# Patient Record
Sex: Male | Born: 1960
Health system: Southern US, Community
[De-identification: ages and names within clinical notes are randomized; demographics above are authoritative.]

## PROBLEM LIST (undated history)

## (undated) DIAGNOSIS — I493 Ventricular premature depolarization: Secondary | ICD-10-CM

## (undated) DIAGNOSIS — E781 Pure hyperglyceridemia: Secondary | ICD-10-CM

## (undated) DIAGNOSIS — E785 Hyperlipidemia, unspecified: Secondary | ICD-10-CM

## (undated) DIAGNOSIS — Z9861 Coronary angioplasty status: Secondary | ICD-10-CM

## (undated) DIAGNOSIS — I2119 ST elevation (STEMI) myocardial infarction involving other coronary artery of inferior wall: Secondary | ICD-10-CM

## (undated) DIAGNOSIS — F419 Anxiety disorder, unspecified: Secondary | ICD-10-CM

## (undated) DIAGNOSIS — I2510 Atherosclerotic heart disease of native coronary artery without angina pectoris: Secondary | ICD-10-CM

## (undated) DIAGNOSIS — Z87891 Personal history of nicotine dependence: Secondary | ICD-10-CM

## (undated) DIAGNOSIS — I10 Essential (primary) hypertension: Secondary | ICD-10-CM

## (undated) HISTORY — DX: Coronary angioplasty status: Z98.61

## (undated) HISTORY — DX: ST elevation (STEMI) myocardial infarction involving other coronary artery of inferior wall: I21.19

## (undated) HISTORY — PX: TONSILLECTOMY: SUR1361

## (undated) HISTORY — DX: Hyperlipidemia, unspecified: E78.5

## (undated) HISTORY — DX: Anxiety disorder, unspecified: F41.9

## (undated) HISTORY — DX: Essential (primary) hypertension: I10

## (undated) HISTORY — DX: Personal history of nicotine dependence: Z87.891

## (undated) HISTORY — PX: TRANSTHORACIC ECHOCARDIOGRAM: SHX275

## (undated) HISTORY — DX: Pure hyperglyceridemia: E78.1

## (undated) HISTORY — PX: INGUINAL HERNIA REPAIR: SUR1180

## (undated) HISTORY — DX: Atherosclerotic heart disease of native coronary artery without angina pectoris: I25.10

## (undated) HISTORY — PX: APPENDECTOMY: SHX54

---

## 2006-10-16 ENCOUNTER — Ambulatory Visit (HOSPITAL_COMMUNITY): Admission: RE | Admit: 2006-10-16 | Discharge: 2006-10-16 | Payer: Self-pay | Admitting: Family Medicine

## 2006-12-20 HISTORY — PX: NM MYOVIEW LTD: HXRAD82

## 2006-12-20 HISTORY — PX: DOPPLER ECHOCARDIOGRAPHY: SHX263

## 2013-02-09 DIAGNOSIS — I2119 ST elevation (STEMI) myocardial infarction involving other coronary artery of inferior wall: Secondary | ICD-10-CM

## 2013-02-09 DIAGNOSIS — I2510 Atherosclerotic heart disease of native coronary artery without angina pectoris: Secondary | ICD-10-CM

## 2013-02-09 HISTORY — DX: Atherosclerotic heart disease of native coronary artery without angina pectoris: I25.10

## 2013-02-09 HISTORY — DX: ST elevation (STEMI) myocardial infarction involving other coronary artery of inferior wall: I21.19

## 2013-02-09 HISTORY — PX: CORONARY ANGIOPLASTY WITH STENT PLACEMENT: SHX49

## 2013-02-27 ENCOUNTER — Inpatient Hospital Stay (HOSPITAL_COMMUNITY): Payer: 59

## 2013-02-27 ENCOUNTER — Encounter (HOSPITAL_COMMUNITY)
Admission: EM | Disposition: A | Discharge status: Home / Self Care | Payer: Self-pay | Source: Other Acute Inpatient Hospital | Attending: Cardiology

## 2013-02-27 ENCOUNTER — Encounter (HOSPITAL_COMMUNITY): Payer: Self-pay | Admitting: Cardiology

## 2013-02-27 ENCOUNTER — Inpatient Hospital Stay (HOSPITAL_COMMUNITY)
Admission: EM | Admit: 2013-02-27 | Discharge: 2013-03-03 | DRG: 247 | Disposition: A | Discharge status: Home / Self Care | Payer: 59 | Source: Other Acute Inpatient Hospital | Attending: Cardiology | Admitting: Cardiology

## 2013-02-27 DIAGNOSIS — Z955 Presence of coronary angioplasty implant and graft: Secondary | ICD-10-CM

## 2013-02-27 DIAGNOSIS — I2582 Chronic total occlusion of coronary artery: Secondary | ICD-10-CM | POA: Diagnosis present

## 2013-02-27 DIAGNOSIS — Z8249 Family history of ischemic heart disease and other diseases of the circulatory system: Secondary | ICD-10-CM

## 2013-02-27 DIAGNOSIS — I2119 ST elevation (STEMI) myocardial infarction involving other coronary artery of inferior wall: Principal | ICD-10-CM | POA: Diagnosis present

## 2013-02-27 DIAGNOSIS — I2510 Atherosclerotic heart disease of native coronary artery without angina pectoris: Secondary | ICD-10-CM | POA: Diagnosis present

## 2013-02-27 DIAGNOSIS — E785 Hyperlipidemia, unspecified: Secondary | ICD-10-CM | POA: Diagnosis present

## 2013-02-27 DIAGNOSIS — I10 Essential (primary) hypertension: Secondary | ICD-10-CM | POA: Diagnosis present

## 2013-02-27 DIAGNOSIS — F17200 Nicotine dependence, unspecified, uncomplicated: Secondary | ICD-10-CM | POA: Diagnosis present

## 2013-02-27 DIAGNOSIS — E669 Obesity, unspecified: Secondary | ICD-10-CM | POA: Diagnosis present

## 2013-02-27 DIAGNOSIS — E876 Hypokalemia: Secondary | ICD-10-CM | POA: Diagnosis not present

## 2013-02-27 DIAGNOSIS — E781 Pure hyperglyceridemia: Secondary | ICD-10-CM | POA: Diagnosis present

## 2013-02-27 HISTORY — PX: LEFT HEART CATHETERIZATION WITH CORONARY ANGIOGRAM: SHX5451

## 2013-02-27 HISTORY — PX: PERCUTANEOUS CORONARY STENT INTERVENTION (PCI-S): SHX5485

## 2013-02-27 LAB — HEMOGLOBIN A1C: Hgb A1c MFr Bld: 5.7 % — ABNORMAL HIGH (ref <5.7)

## 2013-02-27 LAB — BASIC METABOLIC PANEL
CO2: 24 mEq/L (ref 19–32)
Chloride: 101 mEq/L (ref 96–112)
Creatinine, Ser: 0.83 mg/dL (ref 0.50–1.35)
GFR calc Af Amer: >90 mL/min (ref >90)
Potassium: 4.7 mEq/L (ref 3.5–5.1)
Sodium: 134 mEq/L — ABNORMAL LOW (ref 135–145)

## 2013-02-27 LAB — CBC
HCT: 44.5 % (ref 39.0–52.0)
MCHC: 34.6 g/dL (ref 30.0–36.0)
RDW: 13.7 % (ref 11.5–15.5)
WBC: 14.8 10*3/uL — ABNORMAL HIGH (ref 4.0–10.5)

## 2013-02-27 LAB — LIPID PANEL
HDL: 32 mg/dL — ABNORMAL LOW (ref >39)
LDL Cholesterol: 98 mg/dL (ref 0–99)
Total CHOL/HDL Ratio: 6 RATIO
Triglycerides: 311 mg/dL — ABNORMAL HIGH (ref <150)
VLDL: 62 mg/dL — ABNORMAL HIGH (ref 0–40)

## 2013-02-27 LAB — COMPREHENSIVE METABOLIC PANEL
ALT: 33 U/L (ref 0–53)
AST: 28 U/L (ref 0–37)
Albumin: 3.8 g/dL (ref 3.5–5.2)
Alkaline Phosphatase: 76 U/L (ref 39–117)
BUN: 13 mg/dL (ref 6–23)
Chloride: 101 mEq/L (ref 96–112)
Potassium: 3.3 mEq/L — ABNORMAL LOW (ref 3.5–5.1)
Sodium: 134 mEq/L — ABNORMAL LOW (ref 135–145)
Total Bilirubin: 0.3 mg/dL (ref 0.3–1.2)
Total Protein: 6.8 g/dL (ref 6.0–8.3)

## 2013-02-27 LAB — MAGNESIUM: Magnesium: 2.1 mg/dL (ref 1.5–2.5)

## 2013-02-27 LAB — POCT I-STAT, CHEM 8
Calcium, Ion: 1.15 mmol/L (ref 1.12–1.23)
Chloride: 104 mEq/L (ref 96–112)
Creatinine, Ser: 1 mg/dL (ref 0.50–1.35)
Glucose, Bld: 99 mg/dL (ref 70–99)
HCT: 47 % (ref 39.0–52.0)
Hemoglobin: 16 g/dL (ref 13.0–17.0)
Potassium: 3.4 mEq/L — ABNORMAL LOW (ref 3.5–5.1)

## 2013-02-27 LAB — PROTIME-INR: Prothrombin Time: 12.6 seconds (ref 11.6–15.2)

## 2013-02-27 LAB — TROPONIN I
Troponin I: <0.3 ng/mL (ref <0.30)
Troponin I: >20 ng/mL (ref <0.30)

## 2013-02-27 SURGERY — LEFT HEART CATHETERIZATION WITH CORONARY ANGIOGRAM
Anesthesia: LOCAL

## 2013-02-27 MED ORDER — POTASSIUM CHLORIDE CRYS ER 20 MEQ PO TBCR
40.0000 meq | EXTENDED_RELEASE_TABLET | Freq: Once | ORAL | Status: AC
  Administered 2013-02-27: 40 meq via ORAL
  Filled 2013-02-27: qty 2

## 2013-02-27 MED ORDER — TICAGRELOR 90 MG PO TABS
ORAL_TABLET | ORAL | Status: AC
  Filled 2013-02-27: qty 2

## 2013-02-27 MED ORDER — NITROGLYCERIN IN D5W 200-5 MCG/ML-% IV SOLN
2.0000 ug/min | INTRAVENOUS | Status: DC
  Administered 2013-02-27: 5 ug/min via INTRAVENOUS
  Filled 2013-02-27: qty 250

## 2013-02-27 MED ORDER — TICAGRELOR 90 MG PO TABS
90.0000 mg | ORAL_TABLET | Freq: Two times a day (BID) | ORAL | Status: DC
  Administered 2013-02-27 – 2013-03-03 (×8): 90 mg via ORAL
  Filled 2013-02-27 (×10): qty 1

## 2013-02-27 MED ORDER — CARVEDILOL 6.25 MG PO TABS
6.2500 mg | ORAL_TABLET | ORAL | Status: AC
  Administered 2013-02-27: 6.25 mg via ORAL
  Filled 2013-02-27: qty 1

## 2013-02-27 MED ORDER — ZOLPIDEM TARTRATE 5 MG PO TABS
5.0000 mg | ORAL_TABLET | Freq: Every evening | ORAL | Status: DC | PRN
  Administered 2013-02-27 – 2013-03-02 (×2): 5 mg via ORAL
  Filled 2013-02-27 (×2): qty 1

## 2013-02-27 MED ORDER — SODIUM CHLORIDE 0.9 % IV SOLN
0.2500 mg/kg/h | INTRAVENOUS | Status: DC
  Filled 2013-02-27: qty 250

## 2013-02-27 MED ORDER — ACETAMINOPHEN 325 MG PO TABS: 650.0000 mg | ORAL_TABLET | ORAL | Status: DC | PRN

## 2013-02-27 MED ORDER — MIDAZOLAM HCL 2 MG/2ML IJ SOLN
INTRAMUSCULAR | Status: AC
  Filled 2013-02-27: qty 2

## 2013-02-27 MED ORDER — HEPARIN (PORCINE) IN NACL 2-0.9 UNIT/ML-% IJ SOLN
INTRAMUSCULAR | Status: AC
  Filled 2013-02-27: qty 1000

## 2013-02-27 MED ORDER — MAGNESIUM SULFATE 40 MG/ML IJ SOLN
2.0000 g | Freq: Once | INTRAMUSCULAR | Status: AC
  Administered 2013-02-27: 2 g via INTRAVENOUS
  Filled 2013-02-27: qty 50

## 2013-02-27 MED ORDER — SODIUM CHLORIDE 0.9 % IV SOLN
0.2500 mg/kg/h | INTRAVENOUS | Status: AC
  Filled 2013-02-27: qty 250

## 2013-02-27 MED ORDER — NITROGLYCERIN 0.4 MG SL SUBL: 0.4000 mg | SUBLINGUAL_TABLET | SUBLINGUAL | Status: DC | PRN

## 2013-02-27 MED ORDER — ONDANSETRON HCL 4 MG/2ML IJ SOLN: 4.0000 mg | Freq: Four times a day (QID) | INTRAMUSCULAR | Status: DC | PRN

## 2013-02-27 MED ORDER — CARVEDILOL 6.25 MG PO TABS
6.2500 mg | ORAL_TABLET | Freq: Two times a day (BID) | ORAL | Status: DC
  Administered 2013-02-27: 6.25 mg via ORAL
  Filled 2013-02-27 (×2): qty 1

## 2013-02-27 MED ORDER — SODIUM CHLORIDE 0.9 % IV SOLN: 1.0000 mL/kg/h | INTRAVENOUS | Status: AC

## 2013-02-27 MED ORDER — ALPRAZOLAM 0.25 MG PO TABS
0.2500 mg | ORAL_TABLET | Freq: Three times a day (TID) | ORAL | Status: DC | PRN
  Administered 2013-02-27 – 2013-03-02 (×3): 0.25 mg via ORAL
  Filled 2013-02-27 (×2): qty 1

## 2013-02-27 MED ORDER — NITROGLYCERIN 0.2 MG/ML ON CALL CATH LAB
INTRAVENOUS | Status: AC
  Filled 2013-02-27: qty 1

## 2013-02-27 MED ORDER — MORPHINE SULFATE 2 MG/ML IJ SOLN
2.0000 mg | INTRAMUSCULAR | Status: DC | PRN
  Administered 2013-02-27 (×2): 2 mg via INTRAVENOUS
  Filled 2013-02-27 (×3): qty 1

## 2013-02-27 MED ORDER — POTASSIUM CHLORIDE 20 MEQ/15ML (10%) PO LIQD
40.0000 meq | Freq: Every day | ORAL | Status: DC
  Administered 2013-02-27: 40 meq via ORAL
  Filled 2013-02-27 (×2): qty 30

## 2013-02-27 MED ORDER — FENTANYL CITRATE 0.05 MG/ML IJ SOLN
INTRAMUSCULAR | Status: AC
  Filled 2013-02-27: qty 2

## 2013-02-27 MED ORDER — METOPROLOL TARTRATE 25 MG PO TABS: 25.0000 mg | ORAL_TABLET | Freq: Two times a day (BID) | ORAL | Status: DC

## 2013-02-27 MED ORDER — BIVALIRUDIN 250 MG IV SOLR
INTRAVENOUS | Status: AC
  Filled 2013-02-27: qty 250

## 2013-02-27 MED ORDER — ATORVASTATIN CALCIUM 80 MG PO TABS
80.0000 mg | ORAL_TABLET | Freq: Every day | ORAL | Status: DC
  Administered 2013-02-27 – 2013-03-02 (×4): 80 mg via ORAL
  Filled 2013-02-27 (×6): qty 1

## 2013-02-27 MED ORDER — ASPIRIN EC 81 MG PO TBEC
81.0000 mg | DELAYED_RELEASE_TABLET | Freq: Every day | ORAL | Status: DC
  Administered 2013-02-28 – 2013-03-01 (×2): 81 mg via ORAL
  Filled 2013-02-27 (×3): qty 1

## 2013-02-27 MED ORDER — SODIUM CHLORIDE 0.9 % IV SOLN: INTRAVENOUS | Status: DC

## 2013-02-27 MED ORDER — LIDOCAINE HCL (PF) 1 % IJ SOLN
INTRAMUSCULAR | Status: AC
  Filled 2013-02-27: qty 30

## 2013-02-27 MED ORDER — OXYCODONE HCL 5 MG PO TABS: 5.0000 mg | ORAL_TABLET | ORAL | Status: DC | PRN

## 2013-02-27 MED ORDER — CARVEDILOL 12.5 MG PO TABS
12.5000 mg | ORAL_TABLET | Freq: Two times a day (BID) | ORAL | Status: DC
  Administered 2013-02-28 – 2013-03-03 (×7): 12.5 mg via ORAL
  Filled 2013-02-27 (×11): qty 1

## 2013-02-27 NOTE — Care Management Note (Addendum)
    Page 1 of 1   03/03/2013     9:45:06 AM   CARE MANAGEMENT NOTE 03/03/2013  Patient:  Louis Bruce, Louis Bruce   Account Number:  [id number]  Date Initiated:  02/27/2013  Documentation initiated by:  Junius Creamer  Subjective/Objective Assessment:   adm w mi     Action/Plan:   lives w wife   Anticipated DC Date:     Anticipated DC Plan:  HOME/SELF CARE      DC Planning Services  CM consult  Medication Assistance      Choice offered to / List presented to:             Status of service:   Medicare Important Message given?   (If response is "NO", the following Medicare IM given date fields will be blank) Date Medicare IM given:   Date Additional Medicare IM given:    Discharge Disposition:  HOME/SELF CARE  Per UR Regulation:  Reviewed for med. necessity/level of care/duration of stay  If discussed at Long Length of Stay Meetings, dates discussed:    Comments:  12/23  0944 debbie Camila [place] rn,bsn gave pt another 30ay free and copay assist card. went over how to use w pt. for dc home today.  12/19 1617 debbie Benino Korinek rn,bsn left brilinta 30day free and pt assist form w pt's nse. pt just adm from cath lab and nse states anxious and she will give to pt later.

## 2013-02-27 NOTE — H&P (Signed)
Louis Bruce is an 52 y.o. male.    Primary Cardiologist:[facility] Dr. Herbie [place] No primary provider on file.  Chief Complaint: chest pain HPI:  52 year old male present emergently to the cath lab by EMS with STEMI, inf wall ST elevation 9 mm elevation.  Pain began at 1215 today.  EMS called and pt given 4 baby ASA and IV morphine.  Pt has not seen MD in many years.  Hx of elevated triglycerides.  + tobacco use.  Past Medical History  Diagnosis Date  . High triglycerides     in the past  . HTN (hypertension)     treated in the past    Past Surgical History  Procedure Laterality Date  . Appendectomy    . Inguinal hernia repair      Family History  Problem Relation Age of Onset  . Cancer Mother   . Cancer Father   . Cancer Brother    Social History:  reports that he has been smoking.  He has never used smokeless tobacco. He reports that he drinks about 3.0 ounces of alcohol per week. He reports that he does not use illicit drugs.  Allergies: No Known Allergies  OutPatient Medications: none  Results for orders placed during the hospital encounter of 02/27/13 (from the past 48 hour(s))  CBC     Status: Abnormal   Collection Time    02/27/13  2:22 PM      Result Value Range   WBC 14.8 (*) 4.0 - 10.5 K/uL   RBC 5.07  4.22 - 5.81 MIL/uL   Hemoglobin 15.4  13.0 - 17.0 g/dL   HCT 16.1  09.6 - 04.5 %   MCV 87.8  78.0 - 100.0 fL   MCH 30.4  26.0 - 34.0 pg   MCHC 34.6  30.0 - 36.0 g/dL   RDW 40.9  81.1 - 91.4 %   Platelets 254  150 - 400 K/uL  COMPREHENSIVE METABOLIC PANEL     Status: Abnormal   Collection Time    02/27/13  2:22 PM      Result Value Range   Sodium 134 (*) 135 - 145 mEq/L   Potassium 3.3 (*) 3.5 - 5.1 mEq/L   Chloride 101  96 - 112 mEq/L   CO2 15 (*) 19 - 32 mEq/L   Glucose, Bld 93  70 - 99 mg/dL   BUN 13  6 - 23 mg/dL   Creatinine, Ser 7.82  0.50 - 1.35 mg/dL   Calcium 8.8  8.4 - 95.6 mg/dL   Total Protein 6.8  6.0 - 8.3 g/dL   Albumin  3.8  3.5 - 5.2 g/dL   AST 28  0 - 37 U/L   ALT 33  0 - 53 U/L   Alkaline Phosphatase 76  39 - 117 U/L   Total Bilirubin 0.3  0.3 - 1.2 mg/dL   GFR calc non Af Amer >90  >90 mL/min   GFR calc Af Amer >90  >90 mL/min   Comment: (NOTE)     The eGFR has been calculated using the CKD EPI equation.     This calculation has not been validated in all clinical situations.     eGFR's persistently <90 mL/min signify possible Chronic Kidney     Disease.  LIPID PANEL     Status: Abnormal   Collection Time    02/27/13  2:22 PM      Result Value Range  Cholesterol 192  0 - 200 mg/dL   Triglycerides 161 (*) <150 mg/dL   HDL 32 (*) >09 mg/dL   Total CHOL/HDL Ratio 6.0     VLDL 62 (*) 0 - 40 mg/dL   LDL Cholesterol 98  0 - 99 mg/dL   Comment:            Total Cholesterol/HDL:CHD Risk     Coronary Heart Disease Risk Table                         Men   Women      1/2 Average Risk   3.4   3.3      Average Risk       5.0   4.4      2 X Average Risk   9.6   7.1      3 X Average Risk  23.4   11.0                Use the calculated Patient Ratio     above and the CHD Risk Table     to determine the patient's CHD Risk.                ATP III CLASSIFICATION (LDL):      <100     mg/dL   Optimal      604-540  mg/dL   Near or Above                        Optimal      130-159  mg/dL   Borderline      981-191  mg/dL   High      >478     mg/dL   Very High  PROTIME-INR     Status: None   Collection Time    02/27/13  2:22 PM      Result Value Range   Prothrombin Time 12.6  11.6 - 15.2 seconds   INR 0.96  0.00 - 1.49  APTT     Status: None   Collection Time    02/27/13  2:22 PM      Result Value Range   aPTT 27  24 - 37 seconds  CK TOTAL AND CKMB     Status: None   Collection Time    02/27/13  2:22 PM      Result Value Range   Total CK 132  7 - 232 U/L   CK, MB 2.8  0.3 - 4.0 ng/mL   Relative Index 2.1  0.0 - 2.5  TROPONIN I     Status: None   Collection Time    02/27/13  2:22 PM       Result Value Range   Troponin I <0.30  <0.30 ng/mL   Comment:            Due to the release kinetics of cTnI,     a negative result within the first hours     of the onset of symptoms does not rule out     myocardial infarction with certainty.     If myocardial infarction is still suspected,     repeat the test at appropriate intervals.   No results found.  ROS: General:no colds or fevers, no weight changes Skin:no rashes or ulcers HEENT:no blurred vision, no congestion CV:see HPI PUL:see HPI GI:no diarrhea constipation or melena, + hemorrhoids with occ bright bloodno indigestion GU:no hematuria,  no dysuria MS:no joint pain, no claudication Neuro:no syncope, no lightheadedness Endo:no diabetes, no thyroid disease   Height 5\' 9"  (1.753 m), weight 240 lb (108.863 kg). PE: General:Pleasant affect, NAD Skin:Warm and dry, brisk capillary refill HEENT:normocephalic, sclera clear, mucus membranes moist Neck:supple, no JVD, no bruits  Heart:S1S2 RRR without murmur, gallup, rub or click Lungs:clear ant, without rales, rhonchi, or wheezes MVH:QIONG, soft, non tender, + BS, do not palpate liver spleen or masses Ext:no lower ext edema, 2+ -3+ pedal pulses, 2+ radial pulses Neuro:alert and oriented, MAE, follows commands, + facial symmetry    Assessment/Plan Principal Problem:   ST elevation myocardial infarction (STEMI) of inferior wall Active Problems:   Hypokalemia   HTN (hypertension)  PLAN:  To the cath lab emergently.  Then admit to 2 Heart unit for management of MI.  Add BB, statin, ACE as BP tolerates.    [facility] R Nurse Practitioner Certified [facility] Pager [phone] or after 5pm or weekends call [phone] 02/27/2013, 3:23 PM  I have seen and evaluated the patient this PM along with Nada Boozer, NPA. I agree with HER findings, examination as well as impression recommendations.  I met him in the ER & walked with him to Cardiac CATH lab.   Dramatic Inferior STEMI.  Plan emergent cath.   Marykay Lex, M.D., M.S. [facility] [address] [place], [place]  [address]  [phone] Pager # [phone] 02/27/2013 4:07 PM

## 2013-02-27 NOTE — H&P (Signed)
History and Physical Interval Note:  NAME:  Louis Bruce   MRN: [id number] DOB:  [birth year]   ADMIT DATE: 02/27/2013   02/27/2013 3:23 PM  Louis Bruce is a 52 y.o. male with PMH of HTN & HLD & smoker - essentially not taking any medications.  Was finally enjoying a day off today & @ ~1210 PM felt crushing SSCP ~8-10/10. Called his wife after ~30 min.  EMS arrived ~45 min post onset of pain -- ~6-8 mm Inferior STE.  Code STEMI called -- brought directly to cath lab, CP still ~5/10.  Hemodynamically stable.   Past Medical History  Diagnosis Date  . High triglycerides     in the past  . HTN (hypertension)     treated in the past   Past Surgical History  Procedure Laterality Date  . Appendectomy    . Inguinal hernia repair      FAMHx: Family History  Problem Relation Age of Onset  . Cancer Mother   . Cancer Father   . Cancer Brother     SOCHx:  reports that he has been smoking.  He has never used smokeless tobacco. He reports that he drinks about 3.0 ounces of alcohol per week. He reports that he does not use illicit drugs.  ALLERGIES: No Known Allergies  HOME MEDICATIONS: No prescriptions prior to admission    PHYSICAL EXAM:Height 5\' 9"  (1.753 m), weight 240 lb (108.863 kg). Please see full H&P    IMPRESSION & PLAN The patients' history has been reviewed, patient examined, no change in status from most recent note, stable for surgery. I have reviewed the patients' chart and labs. Questions were answered to the patient's satisfaction.    Louis Bruce has presented today for surgery, with the diagnosis of INFERIOR STEMI. The various methods of treatment have been discussed with the patient and family.   Risks / Complications include, but not limited to: Death, MI, CVA/TIA, VF/VT (with defibrillation), Bradycardia (need for temporary pacer placement), contrast induced nephropathy, bleeding / bruising / hematoma / pseudoaneurysm, vascular or coronary injury (with  possible emergent CT or Vascular Surgery), adverse medication reactions, infection.     After consideration of risks, benefits and other options for treatment, the patient has provided emergent verbal consent for Procedure(s):  LEFT HEART CATHETERIZATION AND CORONARY ANGIOGRAPHY +/- AD HOC PERCUTANEOUS CORONARY INTERVENTION  as a surgical intervention.   We will proceed with the planned procedure.   HARDING,[facility] [address] [place], [place]  [address]  [phone]  02/27/2013 3:23 PM

## 2013-02-27 NOTE — CV Procedure (Signed)
CARDIAC CATHETERIZATION AND PERCUTANEOUS CORONARY INTERVENTION REPORT  NAME:  Louis Bruce   MRN: [id number] DOB:  [birth year]   ADMIT DATE: 02/27/2013 Procedure Date: 02/27/2013  INTERVENTIONAL CARDIOLOGIST: Marykay Lex, M.D., MS PRIMARY CARE PROVIDER: No primary provider on file. PRIMARY CARDIOLOGIST: Marykay Lex, MD  PATIENT:  Louis Bruce is a 52 y.o. male who rarely seeks medical attention, but previously diagnosed with HTN & HLD.  He is a long term smoker.  Was in [facility] until roughly 12:15 this afternoon while outside getting ready to go creatinine 100 when he developed substernal chest pressure of roughly 8-9/10. He initially told his wife recently called EMS. They arrived roughly 40 minutes after onset of pain and noted roughly 9 mm inferior ST elevations. Code STEMI was called and he was transferred directly by EMS through the ER to the Cath Lab. I met him in the ER, and he still had about 5/10 pain.  PRE-OPERATIVE DIAGNOSIS:    Inferior ST Elevation MI  PROCEDURES PERFORMED:    Left Heart Catheterization with Coronary Angiography  Successful Percutaneous Coronary Intervention of the distal RCA 100% occlusion with 2 overlapping Xience Alpine DES Stents (3.5 mm x 18 mm distal, 3.5 mm x 8 mm proximal -- postdilated to roughly 3.7 mm  PROCEDURE:Consent:  Risks of procedure as well as the alternatives and risks of each were explained to the patient.  Verbal consent for procedure obtained, as we were unable to obtain consent because of altered level of consciousness. Consent for signed by MD and patient with RN witness -- placed on chart.   PROCEDURE: The patient was brought to the 2nd Floor [place] Cardiac Catheterization Lab in the fasting state and prepped and draped in the usual sterile fashion for Right groin or radial access. A modified Allen's test with plethysmography was performed, revealing excellent Ulnar artery collateral flow.  Sterile technique was used  including antiseptics, cap, gloves, gown, hand hygiene, mask and sheet.  Skin prep: Chlorhexidine.  Time Out: Verified patient identification, verified procedure, site/side was marked, verified correct patient position, special equipment/implants available, medications/allergies/relevent history reviewed, required imaging and test results available.  Performed  Access: Right Radial Artery; 6 Fr Sheath --  Seldinger technique (Angiocath Micropuncture Kit)  IA Radial Cocktail - 10 mL, IV Angiomax Diagnostic:  5 [place] TIG 4.0, 6 [place] JR 4 guide,  5 [place] JL 3.5 and Angled Pigtail advanced and exchanged over long exchange safety J-wire.  Left Coronary Artery Angiography: TIG 4.0 pre-PCI, JL 3.5 post PCI  Right Coronary Artery Angiography: TIG 4.0 followed by 6 [place] JR 4 Guide  LV Hemodynamics (LV Gram): Angled pigtail  TR Band:  1515 Hours, 13 mL air  MEDICATIONS:  Anesthesia:  Local Lidocaine 2 ml  Sedation:  3 mg IV Versed, 75 mcg IV fentanyl ;   Premedication: 324 mg aspirin by EMS  Omnipaque Contrast: 145 ml  Anticoagulation:  Angiomax Bolus & drip  Anti-Platelet Agent:  Brilinta 180 mg Radial Cocktail: 5 mg Verapamil, 400 mcg NTG, 2 ml 2% Lidocaine in 10 ml NS  Hemodynamics:  Central Aortic / Mean Pressures: 115/74 mmHg; 90 mmHg  Left Ventricular Pressures / EDP: 114/15 mmHg; 16 mmHg  Left Ventriculography:  EF:  Roughly 50 %  Wall Motion: Basal to mid inferior hypokinesis  Coronary Anatomy:  Left Main: Large-caliber vessel that trifurcates into the LAD and Circumflex as well as a small Ramus Intermedius. LAD: Begins a large caliber vessel gives rise to a very  proximal septal perforator trunk, just beyond this there is a focal roughly 70% stenosis.  Just beyond this there is a moderate caliber first diagonal branch followed by a moderate roughly 50% stenosis at the takeoff of a second set perforator trunk. As small to moderate caliber second diagonal  branch.  D1: Moderate caliber vessel, with several branches. Minimal luminal irregularities.  D2: Small to moderate caliber vessel, minimal luminal irregularities Left Circumflex: Large-caliber vessel with proximal roughly 20-30% stenosis this vessel is essentially courses as a lateral OM with several small branches. There is a very small AV groove branch beyond the AV groove takeoff there is minimal luminal irregularities.  Ramus intermedius: Small to moderate caliber vessel with a roughly 60% proximal stenosis. This is a barely 2 mm vessel   RCA: Large caliber dominant vessel with an anterior takeoff. There is a very proximal SA nodal branch followed by a roughly 40-50% focal "napkin ring" lesion takeoff of a small atrial branch. The mid vessel has his 20% lesions distally the vessel is 100% occluded after an initial taper.   After wire placement, there was TIMI-3 flow distally beyond the stenosis revealing an extensive Right Posterior Descending Artery and Right Posterior AV Groove Branch (RPAV) with several RPL branches. Beyond the distal RCA occlusion, there is minimal luminal irregularities with the most notable being a focal 20% lesion at the ostium of the PDA.  After reviewing the initial angiography, clear culprit lesion for the inferior STEMI was occluded distal RCA. Potential lesions in the left system were noted and therefore post PCI at JL 35 catheter was used to obtain better angiographic images of the left coronary system. Preparations were made to proceed with emergent PCI of the distal RCA.  Percutaneous Coronary Intervention:   Guide: 6 Fr   JR 4 Guidewire: BMW Predilation Balloon: Trek 2.5 mm x 12 mm;   10 Atm x 36 Sec, 12 Atm x 36 Sec, Stent #1: Xience Alpine DES  3.5  mm x 18 mm;   14 Atm x 45 Sec: Final Diameter 3.65 mm  Post deployment angiography revealed a ~50% stenosis just proximal to the proximal stent edge not previously identified.  The decision was made to  place a second proximally overlapping stent. Stent #2: Xience Alpine DES 3.5  mm x 8 mm;   Deployed: 14 Atm x 45 Sec,    Stent Overlap inflation: 16 Atm x 45 Sec.    Final Diameter = 3.7 mm Post-dilation Balloon: [place] Euphora 3.75 mm x 15 mm; middle of stented area including overlap  14 Atm x 45 Sec,   Final Diameter = 3.7 mm  Post deployment angiography in multiple views, with and without guidewire in place revealed excellent stent deployment and lesion coverage.  There was no evidence of dissection or perforation.  The JR 4 guide cath was exchanged for a JR 3.5 catheter followed by an angled pigtail catheter for continued LCA angiography as well as LV gram.  PATIENT DISPOSITION:    The patient was transferred to the PACU holding area in a hemodynamicaly stable, chest pain free condition.  The patient tolerated the procedure well, and there were no complications.  EBL:   < 10 ml  The patient was stable before, during, and after the procedure.  POST-OPERATIVE DIAGNOSIS:    Significant inferior ST elevations do to 100 occluded in the distal RCA treated with 2 overlapping Xience Alpine DES stents as described.  Residual proximal/mid LAD roughly 70% lesions.  Diffuse moderate  disease throughout the remainder of the coronary arteries.  Preserved EF with basal to mid inferior hypokinesis consistent with RCA stenosis.  PLAN OF CARE:  Admit to CCU for post STEMI/radial cath care.  Continue Angiomax at reduced rate for 4 hours post PCI to  Initiate beta blocker and statin therapy.  I will reviewed images with colleagues to determine appropriateness of proceeding with FFR of the LAD lesion prior to discharge on Monday.  Minimum of 1 year Dual Antiplatelet Therapy  The procedure was discussed with the patient and family with imaging and reviewed.   Marykay Lex, M.D., M.S. [facility] [address] [place], [place]   [address]  [phone]  02/27/2013 3:40 PM

## 2013-02-28 DIAGNOSIS — I2510 Atherosclerotic heart disease of native coronary artery without angina pectoris: Secondary | ICD-10-CM

## 2013-02-28 DIAGNOSIS — I2119 ST elevation (STEMI) myocardial infarction involving other coronary artery of inferior wall: Secondary | ICD-10-CM

## 2013-02-28 LAB — BASIC METABOLIC PANEL
BUN: 9 mg/dL (ref 6–23)
CO2: 23 mEq/L (ref 19–32)
Calcium: 8.8 mg/dL (ref 8.4–10.5)
Chloride: 101 mEq/L (ref 96–112)
Creatinine, Ser: 0.84 mg/dL (ref 0.50–1.35)
GFR calc non Af Amer: >90 mL/min (ref >90)
Glucose, Bld: 98 mg/dL (ref 70–99)
Sodium: 135 mEq/L (ref 135–145)

## 2013-02-28 LAB — TROPONIN I: Troponin I: >20 ng/mL (ref <0.30)

## 2013-02-28 LAB — HEMOGLOBIN A1C
Hgb A1c MFr Bld: 5.7 % — ABNORMAL HIGH (ref <5.7)
Mean Plasma Glucose: 117 mg/dL — ABNORMAL HIGH (ref <117)

## 2013-02-28 LAB — CBC
HCT: 43.8 % (ref 39.0–52.0)
MCH: 30.5 pg (ref 26.0–34.0)
MCHC: 33.8 g/dL (ref 30.0–36.0)
MCV: 90.3 fL (ref 78.0–100.0)
Platelets: 232 10*3/uL (ref 150–400)
RBC: 4.85 MIL/uL (ref 4.22–5.81)
WBC: 16.1 10*3/uL — ABNORMAL HIGH (ref 4.0–10.5)

## 2013-02-28 LAB — TSH: TSH: 1.416 u[IU]/mL (ref 0.350–4.500)

## 2013-02-28 LAB — T4, FREE: Free T4: 1.28 ng/dL (ref 0.80–1.80)

## 2013-02-28 MED ORDER — NICOTINE 7 MG/24HR TD PT24
7.0000 mg | MEDICATED_PATCH | Freq: Every day | TRANSDERMAL | Status: DC
  Administered 2013-02-28 – 2013-03-02 (×3): 7 mg via TRANSDERMAL
  Filled 2013-02-28 (×5): qty 1

## 2013-02-28 NOTE — Progress Notes (Signed)
Subjective:  Feels back to normal  Objective:  Temp:  [98.6 F (37 C)-99.1 F (37.3 C)] 98.6 F (37 C) (12/20 0800) Pulse Rate:  [57-95] 65 (12/20 1100) Resp:  [9-23] 19 (12/20 1100) BP: (91-158)/(54-100) 108/69 mmHg (12/20 1100) SpO2:  [95 %-100 %] 96 % (12/20 1100) Weight:  [240 lb (108.863 kg)-240 lb 4.8 oz (109 kg)] 240 lb 4.8 oz (109 kg) (12/20 0458) Weight change:   Intake/Output from previous day: 12/19 0701 - 12/20 0700 In: 1217.7 [P.O.:600; I.V.:567.7; IV Piggyback:50] Out: 700 [Urine:700]  Intake/Output from this shift: Total I/O In: 400 [P.O.:400] Out: 770 [Urine:770]  Medications: Current Facility-Administered Medications  Medication Dose Route Frequency Provider Last Rate Last Dose  . 0.9 %  sodium chloride infusion   Intravenous Continuous Nada Boozer, NP      . acetaminophen (TYLENOL) tablet 650 mg  650 mg Oral Q4H PRN Nada Boozer, NP      . ALPRAZolam Prudy Feeler) tablet 0.25 mg  0.25 mg Oral TID PRN Nada Boozer, NP   0.25 mg at 02/28/13 0319  . aspirin EC tablet 81 mg  81 mg Oral Daily Nada Boozer, NP   81 mg at 02/28/13 0920  . atorvastatin (LIPITOR) tablet 80 mg  80 mg Oral q1800 Nada Boozer, NP   80 mg at 02/27/13 1734  . carvedilol (COREG) tablet 12.5 mg  12.5 mg Oral BID WC Glori Luis, MD   12.5 mg at 02/28/13 0920  . morphine 2 MG/ML injection 2 mg  2 mg Intravenous Q1H PRN Marykay Lex, MD   2 mg at 02/27/13 1936  . nitroGLYCERIN (NITROSTAT) SL tablet 0.4 mg  0.4 mg Sublingual Q5 Min x 3 PRN Nada Boozer, NP      . nitroGLYCERIN 0.2 mg/mL in dextrose 5 % infusion  2-200 mcg/min Intravenous Continuous Marykay Lex, MD   10 mcg/min at 02/27/13 2200  . ondansetron (ZOFRAN) injection 4 mg  4 mg Intravenous Q6H PRN Nada Boozer, NP      . oxyCODONE (Oxy IR/ROXICODONE) immediate release tablet 5 mg  5 mg Oral Q4H PRN Glori Luis, MD      . Ticagrelor [facility]) tablet 90 mg  90 mg Oral BID Marykay Lex, MD   90 mg at 02/28/13 0920  .  zolpidem (AMBIEN) tablet 5 mg  5 mg Oral QHS PRN,MR X 1 Nada Boozer, NP   5 mg at 02/27/13 2129    Physical Exam: General appearance: alert, cooperative, no distress and moderately obese Neck: no adenopathy, no carotid bruit, no JVD, supple, symmetrical, trachea midline and thyroid not enlarged, symmetric, no tenderness/mass/nodules Lungs: clear to auscultation bilaterally Heart: regular rate and rhythm, S1, S2 normal, no murmur, click, rub or gallop Abdomen: soft, non-tender; bowel sounds normal; no masses,  no organomegaly Extremities: extremities normal, atraumatic, no cyanosis or edema Pulses: 2+ and symmetric healthy radial access site Skin: Skin color, texture, turgor normal. No rashes or lesions Neurologic: Alert and oriented X 3, normal strength and tone. Normal symmetric reflexes. Normal coordination and gait  Lab Results: Results for orders placed during the hospital encounter of 02/27/13 (from the past 48 hour(s))  CBC     Status: Abnormal   Collection Time    02/27/13  2:22 PM      Result Value Range   WBC 14.8 (*) 4.0 - 10.5 K/uL   RBC 5.07  4.22 - 5.81 MIL/uL   Hemoglobin 15.4  13.0 - 17.0 g/dL   HCT  44.5  39.0 - 52.0 %   MCV 87.8  78.0 - 100.0 fL   MCH 30.4  26.0 - 34.0 pg   MCHC 34.6  30.0 - 36.0 g/dL   RDW 16.1  09.6 - 04.5 %   Platelets 254  150 - 400 K/uL  COMPREHENSIVE METABOLIC PANEL     Status: Abnormal   Collection Time    02/27/13  2:22 PM      Result Value Range   Sodium 134 (*) 135 - 145 mEq/L   Potassium 3.3 (*) 3.5 - 5.1 mEq/L   Chloride 101  96 - 112 mEq/L   CO2 15 (*) 19 - 32 mEq/L   Glucose, Bld 93  70 - 99 mg/dL   BUN 13  6 - 23 mg/dL   Creatinine, Ser 4.09  0.50 - 1.35 mg/dL   Calcium 8.8  8.4 - 81.1 mg/dL   Total Protein 6.8  6.0 - 8.3 g/dL   Albumin 3.8  3.5 - 5.2 g/dL   AST 28  0 - 37 U/L   ALT 33  0 - 53 U/L   Alkaline Phosphatase 76  39 - 117 U/L   Total Bilirubin 0.3  0.3 - 1.2 mg/dL   GFR calc non Af Amer >90  >90 mL/min   GFR  calc Af Amer >90  >90 mL/min   Comment: (NOTE)     The eGFR has been calculated using the CKD EPI equation.     This calculation has not been validated in all clinical situations.     eGFR's persistently <90 mL/min signify possible Chronic Kidney     Disease.  HEMOGLOBIN A1C     Status: Abnormal   Collection Time    02/27/13  2:22 PM      Result Value Range   Hemoglobin A1C 5.7 (*) <5.7 %   Comment: (NOTE)                                                                               According to the ADA Clinical Practice Recommendations for 2011, when     HbA1c is used as a screening test:      >=6.5%   Diagnostic of Diabetes Mellitus               (if abnormal result is confirmed)     5.7-6.4%   Increased risk of developing Diabetes Mellitus     References:Diagnosis and Classification of Diabetes Mellitus,Diabetes     Care,2011,34(Suppl 1):S62-S69 and Standards of Medical Care in             Diabetes - 2011,Diabetes Care,2011,34 (Suppl 1):S11-S61.   Mean Plasma Glucose 117 (*) <117 mg/dL   Comment: Performed at [facility]  LIPID PANEL     Status: Abnormal   Collection Time    02/27/13  2:22 PM      Result Value Range   Cholesterol 192  0 - 200 mg/dL   Triglycerides 914 (*) <150 mg/dL   HDL 32 (*) >78 mg/dL   Total CHOL/HDL Ratio 6.0     VLDL 62 (*) 0 - 40 mg/dL   LDL Cholesterol 98  0 - 99 mg/dL  Comment:            Total Cholesterol/HDL:CHD Risk     Coronary Heart Disease Risk Table                         Men   Women      1/2 Average Risk   3.4   3.3      Average Risk       5.0   4.4      2 X Average Risk   9.6   7.1      3 X Average Risk  23.4   11.0                Use the calculated Patient Ratio     above and the CHD Risk Table     to determine the patient's CHD Risk.                ATP III CLASSIFICATION (LDL):      <100     mg/dL   Optimal      161-096  mg/dL   Near or Above                        Optimal      130-159  mg/dL   Borderline       045-409  mg/dL   High      >811     mg/dL   Very High  PROTIME-INR     Status: None   Collection Time    02/27/13  2:22 PM      Result Value Range   Prothrombin Time 12.6  11.6 - 15.2 seconds   INR 0.96  0.00 - 1.49  APTT     Status: None   Collection Time    02/27/13  2:22 PM      Result Value Range   aPTT 27  24 - 37 seconds  CK TOTAL AND CKMB     Status: None   Collection Time    02/27/13  2:22 PM      Result Value Range   Total CK 132  7 - 232 U/L   CK, MB 2.8  0.3 - 4.0 ng/mL   Relative Index 2.1  0.0 - 2.5  TROPONIN I     Status: None   Collection Time    02/27/13  2:22 PM      Result Value Range   Troponin I <0.30  <0.30 ng/mL   Comment:            Due to the release kinetics of cTnI,     a negative result within the first hours     of the onset of symptoms does not rule out     myocardial infarction with certainty.     If myocardial infarction is still suspected,     repeat the test at appropriate intervals.  POCT I-STAT, CHEM 8     Status: Abnormal   Collection Time    02/27/13  2:26 PM      Result Value Range   Sodium 138  135 - 145 mEq/L   Potassium 3.4 (*) 3.5 - 5.1 mEq/L   Chloride 104  96 - 112 mEq/L   BUN 12  6 - 23 mg/dL   Creatinine, Ser 9.14  0.50 - 1.35 mg/dL   Glucose, Bld 99  70 - 99 mg/dL   Calcium, Ion 7.82  1.12 - 1.23 mmol/L   TCO2 15  0 - 100 mmol/L   Hemoglobin 16.0  13.0 - 17.0 g/dL   HCT 16.1  09.6 - 04.5 %  POCT ACTIVATED CLOTTING TIME     Status: None   Collection Time    02/27/13  2:31 PM      Result Value Range   Activated Clotting Time 370    MRSA PCR SCREENING     Status: None   Collection Time    02/27/13  3:45 PM      Result Value Range   MRSA by PCR NEGATIVE  NEGATIVE   Comment:            The GeneXpert MRSA Assay (FDA     approved for NASAL specimens     only), is one component of a     comprehensive MRSA colonization     surveillance program. It is not     intended to diagnose MRSA     infection nor to guide or      monitor treatment for     MRSA infections.  TROPONIN I     Status: Abnormal   Collection Time    02/27/13  5:15 PM      Result Value Range   Troponin I >20.00 (*) <0.30 ng/mL   Comment:            Due to the release kinetics of cTnI,     a negative result within the first hours     of the onset of symptoms does not rule out     myocardial infarction with certainty.     If myocardial infarction is still suspected,     repeat the test at appropriate intervals.     CRITICAL RESULT CALLED TO, READ BACK BY AND VERIFIED WITH:     RN T. Earl Lites 1829 02/27/13 Sallyanne Kuster M.     REPEATED TO VERIFY  TSH     Status: None   Collection Time    02/27/13  5:15 PM      Result Value Range   TSH 1.416  0.350 - 4.500 uIU/mL   Comment: Performed at [facility]  T4, FREE     Status: None   Collection Time    02/27/13  5:15 PM      Result Value Range   Free T4 1.28  0.80 - 1.80 ng/dL   Comment: Performed at [facility]  HEMOGLOBIN A1C     Status: Abnormal   Collection Time    02/27/13  5:15 PM      Result Value Range   Hemoglobin A1C 5.7 (*) <5.7 %   Comment: (NOTE)                                                                               According to the ADA Clinical Practice Recommendations for 2011, when     HbA1c is used as a screening test:      >=6.5%   Diagnostic of Diabetes Mellitus               (if abnormal result is confirmed)     5.7-6.4%   Increased risk  of developing Diabetes Mellitus     References:Diagnosis and Classification of Diabetes Mellitus,Diabetes     Care,2011,34(Suppl 1):S62-S69 and Standards of Medical Care in             Diabetes - 2011,Diabetes Care,2011,34 (Suppl 1):S11-S61.   Mean Plasma Glucose 117 (*) <117 mg/dL   Comment: Performed at [facility]  BASIC METABOLIC PANEL     Status: Abnormal   Collection Time    02/27/13 10:18 PM      Result Value Range   Sodium 134 (*) 135 - 145 mEq/L   Potassium 4.7  3.5 - 5.1 mEq/L   Comment:  DELTA CHECK NOTED   Chloride 101  96 - 112 mEq/L   CO2 24  19 - 32 mEq/L   Glucose, Bld 119 (*) 70 - 99 mg/dL   BUN 10  6 - 23 mg/dL   Creatinine, Ser 1.61  0.50 - 1.35 mg/dL   Calcium 8.4  8.4 - 09.6 mg/dL   GFR calc non Af Amer >90  >90 mL/min   GFR calc Af Amer >90  >90 mL/min   Comment: (NOTE)     The eGFR has been calculated using the CKD EPI equation.     This calculation has not been validated in all clinical situations.     eGFR's persistently <90 mL/min signify possible Chronic Kidney     Disease.  MAGNESIUM     Status: None   Collection Time    02/27/13 10:18 PM      Result Value Range   Magnesium 2.1  1.5 - 2.5 mg/dL  TROPONIN I     Status: Abnormal   Collection Time    02/28/13  4:45 AM      Result Value Range   Troponin I >20.00 (*) <0.30 ng/mL   Comment:            Due to the release kinetics of cTnI,     a negative result within the first hours     of the onset of symptoms does not rule out     myocardial infarction with certainty.     If myocardial infarction is still suspected,     repeat the test at appropriate intervals.     CRITICAL VALUE NOTED.  VALUE IS CONSISTENT WITH PREVIOUSLY REPORTED AND CALLED VALUE.  CBC     Status: Abnormal   Collection Time    02/28/13  4:45 AM      Result Value Range   WBC 16.1 (*) 4.0 - 10.5 K/uL   RBC 4.85  4.22 - 5.81 MIL/uL   Hemoglobin 14.8  13.0 - 17.0 g/dL   HCT 04.5  40.9 - 81.1 %   MCV 90.3  78.0 - 100.0 fL   MCH 30.5  26.0 - 34.0 pg   MCHC 33.8  30.0 - 36.0 g/dL   RDW 91.4  78.2 - 95.6 %   Platelets 232  150 - 400 K/uL  BASIC METABOLIC PANEL     Status: None   Collection Time    02/28/13  4:45 AM      Result Value Range   Sodium 135  135 - 145 mEq/L   Potassium 4.1  3.5 - 5.1 mEq/L   Chloride 101  96 - 112 mEq/L   CO2 23  19 - 32 mEq/L   Glucose, Bld 98  70 - 99 mg/dL   BUN 9  6 - 23 mg/dL   Creatinine, Ser 2.13  0.50 -  1.35 mg/dL   Calcium 8.8  8.4 - 16.1 mg/dL   GFR calc non Af Amer >90  >90 mL/min     GFR calc Af Amer >90  >90 mL/min   Comment: (NOTE)     The eGFR has been calculated using the CKD EPI equation.     This calculation has not been validated in all clinical situations.     eGFR's persistently <90 mL/min signify possible Chronic Kidney     Disease.    Imaging: Imaging results have been reviewed and Dg Chest Port 1 View  02/27/2013   CLINICAL DATA:  Hypertension.  MI.  EXAM: PORTABLE CHEST - 1 VIEW  COMPARISON:  10/16/2006.  FINDINGS: Mediastinum and hilar structures are normal. Mild cardiomegaly and pulmonary venous congestion is present. No overt pulmonary edema. No focal infiltrate. Mild atelectasis left lung base. No pleural effusion. No pneumothorax. No acute bony abnormality.  IMPRESSION: Mild cardiomegaly and pulmonary venous congestion. No overt pulmonary edema.   Electronically Signed   By: Maisie Fus  Register   On: 02/27/2013 16:56    Assessment:  1. Principal Problem: 2.   ST elevation myocardial infarction (STEMI) of inferior wall 3. Active Problems: 4.   Hypokalemia 5.   HTN (hypertension) 6.   CAD (coronary artery disease)- residual LAD disease 7.   Plan:  1. Echo to be done today. 2. Quite likely DC after cath tomorrow unless FFR shows need for LAD PCI 3. Transfer telemetry 4. Strongly encouraged to take a break from work and fully reap benefit of cardiac rehab   Time Spent Directly with Patient:  30 minutes  Length of Stay:  LOS: 1 day    Alyssamarie Mounsey 02/28/2013, 12:34 PM

## 2013-02-28 NOTE — Progress Notes (Signed)
CARDIAC REHAB PHASE I   PRE:  Rate/Rhythm: 75 sinus  BP:  Sitting: 127/80     SaO2: 98 RA  MODE:  Ambulation: 700 ft   POST:  Rate/Rhythem: 81 sinus  BP:  Sitting: 150/85     SaO2: 99 RA  Order received and appreciated.  Pt ambulated 700 ft with assist x1.  Pt tolerated walk well without complaint.  Gait was steady but quick.  Reviewed d/c education: diet, exercise, stress, smoking, and Cardiac Rehab Phase II.  Pt request info to be sent to [facility] for Rehab.  Pt and wife voiced understanding. Fabio Pierce, MA, ACSM RCEP  Juanetta Gosling, Doy Mince

## 2013-03-01 DIAGNOSIS — F17200 Nicotine dependence, unspecified, uncomplicated: Secondary | ICD-10-CM | POA: Diagnosis present

## 2013-03-01 DIAGNOSIS — Z8249 Family history of ischemic heart disease and other diseases of the circulatory system: Secondary | ICD-10-CM

## 2013-03-01 DIAGNOSIS — E785 Hyperlipidemia, unspecified: Secondary | ICD-10-CM | POA: Diagnosis present

## 2013-03-01 MED ORDER — SODIUM CHLORIDE 0.9 % IV SOLN
INTRAVENOUS | Status: DC
  Administered 2013-03-02: 04:00:00 via INTRAVENOUS

## 2013-03-01 MED ORDER — ASPIRIN EC 81 MG PO TBEC
81.0000 mg | DELAYED_RELEASE_TABLET | Freq: Every day | ORAL | Status: DC
  Administered 2013-03-03: 81 mg via ORAL
  Filled 2013-03-01: qty 1

## 2013-03-01 MED ORDER — ASPIRIN 81 MG PO CHEW
81.0000 mg | CHEWABLE_TABLET | ORAL | Status: AC
  Administered 2013-03-02: 81 mg via ORAL
  Filled 2013-03-01: qty 1

## 2013-03-01 NOTE — Progress Notes (Signed)
CRITICAL VALUE ALERT  Critical value received: Gram positive cocci in clusters   Date of notification:  03/01/2013  Time of notification:  17:40   Critical value read back:yes  Nurse who received alert:  Lethea Killings   MD notified (1st page):  Dr. Donnie Aho  Time of first page:  17:50   MD notified (2nd page):  Time of second page:  Responding MD:  Dr. Donnie Aho  Time MD responded: 17:55

## 2013-03-01 NOTE — Progress Notes (Signed)
Subjective:  No chest pain  Objective:  Vital Signs in the last 24 hours: Temp:  [98.2 F (36.8 C)-98.3 F (36.8 C)] 98.3 F (36.8 C) (12/21 0610) Pulse Rate:  [61-73] 65 (12/21 0610) Resp:  [15-19] 18 (12/21 0610) BP: (105-129)/(66-87) 105/71 mmHg (12/21 0610) SpO2:  [96 %-98 %] 96 % (12/21 0610) Weight:  [257 lb 11.5 oz (116.9 kg)] 257 lb 11.5 oz (116.9 kg) (12/21 0610)  Intake/Output from previous day:  Intake/Output Summary (Last 24 hours) at 03/01/13 0859 Last data filed at 02/28/13 1700  Gross per 24 hour  Intake    360 ml  Output    420 ml  Net    -60 ml    Physical Exam: General appearance: alert, cooperative and no distress Lungs: clear to auscultation bilaterally Heart: regular rate and rhythm Extremities: Rt wrist without hematoma   Rate: 66  Rhythm: normal sinus rhythm  Lab Results:  Recent Labs  02/27/13 1422 02/27/13 1426 02/28/13 0445  WBC 14.8*  --  16.1*  HGB 15.4 16.0 14.8  PLT 254  --  232    Recent Labs  02/27/13 2218 02/28/13 0445  NA 134* 135  K 4.7 4.1  CL 101 101  CO2 24 23  GLUCOSE 119* 98  BUN 10 9  CREATININE 0.83 0.84    Recent Labs  02/27/13 1715 02/28/13 0445  TROPONINI >20.00* >20.00*    Recent Labs  02/27/13 1422  INR 0.96    Imaging: Imaging results have been reviewed  Cardiac Studies:  Assessment/Plan:   Principal Problem:   STEMI of inferior wall 02/27/13- RCA DES X 2 Active Problems:   CAD - residual LAD disease   HTN (hypertension)   Dyslipidemia   Smoker   Family history of early CAD    PLAN: Cath in am with plans for possible LAD PCI.  Corine Shelter PA-C Beeper [phone] 03/01/2013, 8:59 AM   I have seen and examined the patient along with Corine Shelter PA-C.  I have reviewed the chart, notes and new data.  I agree with PA's note.  Key new complaints: feels well Key examination changes: no signs of CHF or arrhythmia Key new findings / data: normal BUn/creat  PLAN: Caht possible  PCI to LAD in AM.  Thurmon Fair, MD, [facility] [phone] 03/01/2013, 11:07 AM

## 2013-03-02 ENCOUNTER — Encounter (HOSPITAL_COMMUNITY)
Admission: EM | Disposition: A | Discharge status: Home / Self Care | Payer: Self-pay | Source: Other Acute Inpatient Hospital | Attending: Cardiology

## 2013-03-02 DIAGNOSIS — F17200 Nicotine dependence, unspecified, uncomplicated: Secondary | ICD-10-CM

## 2013-03-02 DIAGNOSIS — I2510 Atherosclerotic heart disease of native coronary artery without angina pectoris: Secondary | ICD-10-CM

## 2013-03-02 HISTORY — PX: FRACTIONAL FLOW RESERVE WIRE: SHX5839

## 2013-03-02 LAB — BASIC METABOLIC PANEL
BUN: 19 mg/dL (ref 6–23)
CO2: 23 mEq/L (ref 19–32)
Calcium: 8.8 mg/dL (ref 8.4–10.5)
Chloride: 103 mEq/L (ref 96–112)
Creatinine, Ser: 0.96 mg/dL (ref 0.50–1.35)
GFR calc Af Amer: >90 mL/min (ref >90)
GFR calc non Af Amer: >90 mL/min (ref >90)
Glucose, Bld: 96 mg/dL (ref 70–99)
Potassium: 3.9 mEq/L (ref 3.5–5.1)
Sodium: 135 mEq/L (ref 135–145)

## 2013-03-02 LAB — CBC WITH DIFFERENTIAL/PLATELET
Basophils Absolute: 0 10*3/uL (ref 0.0–0.1)
Basophils Relative: 0 % (ref 0–1)
Eosinophils Relative: 2 % (ref 0–5)
HCT: 45.1 % (ref 39.0–52.0)
Lymphocytes Relative: 28 % (ref 12–46)
Lymphs Abs: 3.4 10*3/uL (ref 0.7–4.0)
MCHC: 33.5 g/dL (ref 30.0–36.0)
MCV: 92 fL (ref 78.0–100.0)
Monocytes Relative: 10 % (ref 3–12)
Neutro Abs: 7.4 10*3/uL (ref 1.7–7.7)
Platelets: 215 10*3/uL (ref 150–400)
RBC: 4.9 MIL/uL (ref 4.22–5.81)
RDW: 13.9 % (ref 11.5–15.5)
WBC: 12.2 10*3/uL — ABNORMAL HIGH (ref 4.0–10.5)

## 2013-03-02 LAB — POCT ACTIVATED CLOTTING TIME: Activated Clotting Time: 359 seconds

## 2013-03-02 SURGERY — FRACTIONAL FLOW RESERVE WIRE
Anesthesia: LOCAL

## 2013-03-02 MED ORDER — HEPARIN (PORCINE) IN NACL 2-0.9 UNIT/ML-% IJ SOLN
INTRAMUSCULAR | Status: AC
  Filled 2013-03-02: qty 1500

## 2013-03-02 MED ORDER — LIDOCAINE HCL (PF) 1 % IJ SOLN
INTRAMUSCULAR | Status: AC
  Filled 2013-03-02: qty 30

## 2013-03-02 MED ORDER — NITROGLYCERIN 0.2 MG/ML ON CALL CATH LAB
INTRAVENOUS | Status: AC
  Filled 2013-03-02: qty 1

## 2013-03-02 MED ORDER — ADENOSINE (DIAGNOSTIC) 3 MG/ML IV SOLN
20.0000 mL | Freq: Once | INTRAVENOUS | Status: AC
  Administered 2013-03-02: 60 mg via INTRAVENOUS
  Filled 2013-03-02: qty 20

## 2013-03-02 MED ORDER — ONDANSETRON HCL 4 MG/2ML IJ SOLN: 4.0000 mg | Freq: Four times a day (QID) | INTRAMUSCULAR | Status: DC | PRN

## 2013-03-02 MED ORDER — FENTANYL CITRATE 0.05 MG/ML IJ SOLN
INTRAMUSCULAR | Status: AC
  Filled 2013-03-02: qty 2

## 2013-03-02 MED ORDER — SODIUM CHLORIDE 0.9 % IV SOLN: INTRAVENOUS | Status: AC

## 2013-03-02 MED ORDER — MIDAZOLAM HCL 2 MG/2ML IJ SOLN
INTRAMUSCULAR | Status: AC
  Filled 2013-03-02: qty 2

## 2013-03-02 MED ORDER — DIAZEPAM 5 MG PO TABS
5.0000 mg | ORAL_TABLET | ORAL | Status: AC
  Administered 2013-03-02: 5 mg via ORAL
  Filled 2013-03-02: qty 1

## 2013-03-02 MED ORDER — TICAGRELOR 90 MG PO TABS: 90.0000 mg | ORAL_TABLET | Freq: Two times a day (BID) | ORAL | Status: DC

## 2013-03-02 MED ORDER — ATROPINE SULFATE 0.1 MG/ML IJ SOLN
INTRAMUSCULAR | Status: AC
  Filled 2013-03-02: qty 10

## 2013-03-02 MED ORDER — ASPIRIN 81 MG PO CHEW: 81.0000 mg | CHEWABLE_TABLET | Freq: Every day | ORAL | Status: DC

## 2013-03-02 MED ORDER — MORPHINE SULFATE 2 MG/ML IJ SOLN: 2.0000 mg | INTRAMUSCULAR | Status: DC | PRN

## 2013-03-02 MED ORDER — BIVALIRUDIN 250 MG IV SOLR
INTRAVENOUS | Status: AC
  Filled 2013-03-02: qty 250

## 2013-03-02 MED ORDER — ACETAMINOPHEN 325 MG PO TABS: 650.0000 mg | ORAL_TABLET | ORAL | Status: DC | PRN

## 2013-03-02 MED FILL — Sodium Chloride IV Soln 0.9%: INTRAVENOUS | Qty: 50 | Status: AC

## 2013-03-02 NOTE — CV Procedure (Signed)
Louis Bruce is a 52 y.o. male    [id number] LOCATION:  FACILITY: [facility]  PHYSICIAN: Nanetta Batty, M.D. Sep 14, 1960   DATE OF PROCEDURE:  03/02/2013  DATE OF DISCHARGE:     CARDIAC CATHETERIZATION     History obtained from chart review. [name] is a 52 year old moderately overweight Caucasian male who had an inferior STEMI on 1219. He had PCI and stenting by Dr. Bryan Lemma. His risk factors include hypertension, hyperlipidemia, and tobacco abuse. Dr. Herbie Baltimore demonstrated what appeared to be a 70% smooth proximal LAD stenosis after the first large septal perforator. Plan was to bring him back in to perform FFR of his proximal LAD to determine physiologic significance the potential need for intervention.   PROCEDURE DESCRIPTION:   The patient was brought to the second floor [place] [facility]iac cath lab in the postabsorptive state. He was premedicated with Valium 5 mg by mouth. His right groin was prepped and shaved in usual sterile fashion. Xylocaine 1% was used for local anesthesia. A sex [place] sheath was inserted into the right common femoral  artery using standard Seldinger technique. Angiomax bolus with an ACT of 357. A total of 50 cc of contrast was administered to the patient. Using a 6 [place] XB LAD 3.5 cm guide catheter angiography was performed.  HEMODYNAMICS:    AO SYSTOLIC/AO DIASTOLIC: 94/62     ANGIOGRAPHIC RESULTS:   1. Left main; normal  2. LAD; widely patent proximal LAD with a focal 50% stenosis in the mid LAD. 3. Left circumflex; normal.  4. Right coronary artery; not visualized  IMPRESSION:widely patent proximal LAD. It's possible that the abnormality of the LAD noted cath on the 19th in the setting of a STEMI with spasm. I cannot document that stenosis in multiple angiographic views. Continue medical therapy will be recommended. The guidewire and catheter were removed. The sheath was secured. Ill-appearing remove 2 hours after Intimax was discontinued  and pressure will be held. He'll be hydrated overnight and discharged home in the morning on dual antiplatelet therapy.  Runell Gess MD, [facility] 03/02/2013 5:54 PM

## 2013-03-02 NOTE — Progress Notes (Deleted)
Sheath pulled at this time.

## 2013-03-02 NOTE — Progress Notes (Signed)
Subjective:  No CP/SOB  Objective:  Temp:  [97.5 F (36.4 C)-98.8 F (37.1 C)] 97.7 F (36.5 C) (12/22 0551) Pulse Rate:  [63-100] 68 (12/22 0811) Resp:  [18] 18 (12/22 0551) BP: (102-126)/(71-86) 102/71 mmHg (12/22 0811) SpO2:  [96 %-98 %] 98 % (12/22 0551) Weight:  [256 lb 12.8 oz (116.484 kg)] 256 lb 12.8 oz (116.484 kg) (12/22 0551) Weight change: -14.7 oz (-0.416 kg)  Intake/Output from previous day: 12/21 0701 - 12/22 0700 In: 240 [P.O.:240] Out: -   Intake/Output from this shift:    Physical Exam: General appearance: alert and no distress Neck: no adenopathy, no carotid bruit, no JVD, supple, symmetrical, trachea midline and thyroid not enlarged, symmetric, no tenderness/mass/nodules Lungs: clear to auscultation bilaterally Heart: regular rate and rhythm, S1, S2 normal, no murmur, click, rub or gallop Extremities: extremities normal, atraumatic, no cyanosis or edema  Lab Results: Results for orders placed during the hospital encounter of 02/27/13 (from the past 48 hour(s))  CBC WITH DIFFERENTIAL     Status: Abnormal   Collection Time    03/02/13  5:35 AM      Result Value Range   WBC 12.2 (*) 4.0 - 10.5 K/uL   RBC 4.90  4.22 - 5.81 MIL/uL   Hemoglobin 15.1  13.0 - 17.0 g/dL   HCT 16.1  09.6 - 04.5 %   MCV 92.0  78.0 - 100.0 fL   MCH 30.8  26.0 - 34.0 pg   MCHC 33.5  30.0 - 36.0 g/dL   RDW 40.9  81.1 - 91.4 %   Platelets 215  150 - 400 K/uL   Neutrophils Relative % 60  43 - 77 %   Lymphocytes Relative 28  12 - 46 %   Monocytes Relative 10  3 - 12 %   Eosinophils Relative 2  0 - 5 %   Basophils Relative 0  0 - 1 %   Neutro Abs 7.4  1.7 - 7.7 K/uL   Lymphs Abs 3.4  0.7 - 4.0 K/uL   Monocytes Absolute 1.2 (*) 0.1 - 1.0 K/uL   Eosinophils Absolute 0.2  0.0 - 0.7 K/uL   Basophils Absolute 0.0  0.0 - 0.1 K/uL   WBC Morphology ATYPICAL LYMPHOCYTES     Comment: ATYPICAL MONONUCLEAR CELLS     TOXIC GRANULATION  BASIC METABOLIC PANEL     Status: None   Collection Time    03/02/13  5:35 AM      Result Value Range   Sodium 135  135 - 145 mEq/L   Potassium 3.9  3.5 - 5.1 mEq/L   Chloride 103  96 - 112 mEq/L   CO2 23  19 - 32 mEq/L   Glucose, Bld 96  70 - 99 mg/dL   BUN 19  6 - 23 mg/dL   Creatinine, Ser 7.82  0.50 - 1.35 mg/dL   Calcium 8.8  8.4 - 95.6 mg/dL   GFR calc non Af Amer >90  >90 mL/min   GFR calc Af Amer >90  >90 mL/min   Comment: (NOTE)     The eGFR has been calculated using the CKD EPI equation.     This calculation has not been validated in all clinical situations.     eGFR's persistently <90 mL/min signify possible Chronic Kidney     Disease.    Imaging: Imaging results have been reviewed  Assessment/Plan:   1. Principal Problem: 2.   STEMI of inferior wall 02/27/13- RCA  DES X 2 3. Active Problems: 4.   HTN (hypertension) 5.   CAD - residual LAD disease 6.   Dyslipidemia 7.   Smoker 8.   Family history of early CAD 32.   Time Spent Directly with Patient:  20 minutes  Length of Stay:  LOS: 3 days   S/P STEMI with intervention performed by Dr. Herbie Baltimore on dominant RCA with DES. Residual LAD disease. Plan is for FFR LAD +/- PCI. Exam benign. Labs OK. Discussed with pt.   Louis Bruce 03/02/2013, 9:11 AM

## 2013-03-02 NOTE — Progress Notes (Signed)
CARDIAC REHAB PHASE I   PRE:  Rate/Rhythm: 69SR  BP:  Supine:   Sitting: 99/78  Standing:    SaO2:   MODE:  Ambulation: 900 ft   POST:  Rate/Rhythm: 90  BP:  Supine:   Sitting: 116/78  Standing:    SaO2: 100%RA 1405-1435 Came to walk with pt. He stated he walked 10 minutes at steady pace yesterday 3 different times without difficulty. Discussed with pt that we usually start at 5 minutes and add minute daily because heart is muscle and needs to heal. Pt wanted to walk 10 minutes again but limited to 6 minutes. Pt has very fast pace which he stated is slower than he normally walks. I could barely keep up with pt. VSS. No CP. Reviewed 10 lb restriction for lifting at home.    Luetta Nutting, RN BSN  03/02/2013 2:31 PM

## 2013-03-02 NOTE — Interval H&P Note (Signed)
Cath Lab Visit (complete for each Cath Lab visit)  Clinical Evaluation Leading to the Procedure:   ACS: yes  Non-ACS:    Anginal Classification: CCS IV  Anti-ischemic medical therapy: Maximal Therapy (2 or more classes of medications)  Non-Invasive Test Results: No non-invasive testing performed  Prior CABG: No previous CABG      History and Physical Interval Note:  03/02/2013 5:22 PM  Louis Bruce  has presented today for surgery, with the diagnosis of cp  The various methods of treatment have been discussed with the patient and family. After consideration of risks, benefits and other options for treatment, the patient has consented to  Procedure(s): FRACTIONAL FLOW RESERVE WIRE (N/A) as a surgical intervention .  The patient's history has been reviewed, patient examined, no change in status, stable for surgery.  I have reviewed the patient's chart and labs.  Questions were answered to the patient's satisfaction.     Runell Gess

## 2013-03-02 NOTE — H&P (View-Only) (Signed)
   Subjective:  No CP/SOB  Objective:  Temp:  [97.5 F (36.4 C)-98.8 F (37.1 C)] 97.7 F (36.5 C) (12/22 0551) Pulse Rate:  [63-100] 68 (12/22 0811) Resp:  [18] 18 (12/22 0551) BP: (102-126)/(71-86) 102/71 mmHg (12/22 0811) SpO2:  [96 %-98 %] 98 % (12/22 0551) Weight:  [256 lb 12.8 oz (116.484 kg)] 256 lb 12.8 oz (116.484 kg) (12/22 0551) Weight change: -14.7 oz (-0.416 kg)  Intake/Output from previous day: 12/21 0701 - 12/22 0700 In: 240 [P.O.:240] Out: -   Intake/Output from this shift:    Physical Exam: General appearance: alert and no distress Neck: no adenopathy, no carotid bruit, no JVD, supple, symmetrical, trachea midline and thyroid not enlarged, symmetric, no tenderness/mass/nodules Lungs: clear to auscultation bilaterally Heart: regular rate and rhythm, S1, S2 normal, no murmur, click, rub or gallop Extremities: extremities normal, atraumatic, no cyanosis or edema  Lab Results: Results for orders placed during the hospital encounter of 02/27/13 (from the past 48 hour(s))  CBC WITH DIFFERENTIAL     Status: Abnormal   Collection Time    03/02/13  5:35 AM      Result Value Range   WBC 12.2 (*) 4.0 - 10.5 K/uL   RBC 4.90  4.22 - 5.81 MIL/uL   Hemoglobin 15.1  13.0 - 17.0 g/dL   HCT 45.1  39.0 - 52.0 %   MCV 92.0  78.0 - 100.0 fL   MCH 30.8  26.0 - 34.0 pg   MCHC 33.5  30.0 - 36.0 g/dL   RDW 13.9  11.5 - 15.5 %   Platelets 215  150 - 400 K/uL   Neutrophils Relative % 60  43 - 77 %   Lymphocytes Relative 28  12 - 46 %   Monocytes Relative 10  3 - 12 %   Eosinophils Relative 2  0 - 5 %   Basophils Relative 0  0 - 1 %   Neutro Abs 7.4  1.7 - 7.7 K/uL   Lymphs Abs 3.4  0.7 - 4.0 K/uL   Monocytes Absolute 1.2 (*) 0.1 - 1.0 K/uL   Eosinophils Absolute 0.2  0.0 - 0.7 K/uL   Basophils Absolute 0.0  0.0 - 0.1 K/uL   WBC Morphology ATYPICAL LYMPHOCYTES     Comment: ATYPICAL MONONUCLEAR CELLS     TOXIC GRANULATION  BASIC METABOLIC PANEL     Status: None   Collection Time    03/02/13  5:35 AM      Result Value Range   Sodium 135  135 - 145 mEq/L   Potassium 3.9  3.5 - 5.1 mEq/L   Chloride 103  96 - 112 mEq/L   CO2 23  19 - 32 mEq/L   Glucose, Bld 96  70 - 99 mg/dL   BUN 19  6 - 23 mg/dL   Creatinine, Ser 0.96  0.50 - 1.35 mg/dL   Calcium 8.8  8.4 - 10.5 mg/dL   GFR calc non Af Amer >90  >90 mL/min   GFR calc Af Amer >90  >90 mL/min   Comment: (NOTE)     The eGFR has been calculated using the CKD EPI equation.     This calculation has not been validated in all clinical situations.     eGFR's persistently <90 mL/min signify possible Chronic Kidney     Disease.    Imaging: Imaging results have been reviewed  Assessment/Plan:   1. Principal Problem: 2.   STEMI of inferior wall 02/27/13- RCA   DES X 2 3. Active Problems: 4.   HTN (hypertension) 5.   CAD - residual LAD disease 6.   Dyslipidemia 7.   Smoker 8.   Family history of early CAD 9.   Time Spent Directly with Patient:  20 minutes  Length of Stay:  LOS: 3 days   S/P STEMI with intervention performed by Dr. Harding on dominant RCA with DES. Residual LAD disease. Plan is for FFR LAD +/- PCI. Exam benign. Labs OK. Discussed with pt.   Louis Bruce J 03/02/2013, 9:11 AM 

## 2013-03-03 ENCOUNTER — Other Ambulatory Visit: Payer: Self-pay | Admitting: Cardiology

## 2013-03-03 DIAGNOSIS — I2510 Atherosclerotic heart disease of native coronary artery without angina pectoris: Secondary | ICD-10-CM

## 2013-03-03 DIAGNOSIS — Z955 Presence of coronary angioplasty implant and graft: Secondary | ICD-10-CM

## 2013-03-03 DIAGNOSIS — I213 ST elevation (STEMI) myocardial infarction of unspecified site: Secondary | ICD-10-CM

## 2013-03-03 DIAGNOSIS — Z9861 Coronary angioplasty status: Secondary | ICD-10-CM

## 2013-03-03 LAB — BASIC METABOLIC PANEL
BUN: 16 mg/dL (ref 6–23)
CO2: 26 mEq/L (ref 19–32)
Chloride: 103 mEq/L (ref 96–112)
Creatinine, Ser: 0.96 mg/dL (ref 0.50–1.35)
GFR calc Af Amer: >90 mL/min (ref >90)
Potassium: 4.5 mEq/L (ref 3.5–5.1)
Sodium: 138 mEq/L (ref 135–145)

## 2013-03-03 LAB — CK TOTAL AND CKMB (NOT AT ARMC)
CK, MB: 3.4 ng/mL (ref 0.3–4.0)
Relative Index: 1.6 (ref 0.0–2.5)
Total CK: 219 U/L (ref 7–232)

## 2013-03-03 LAB — CBC
Hemoglobin: 15.7 g/dL (ref 13.0–17.0)
MCH: 31.3 pg (ref 26.0–34.0)
Platelets: 240 10*3/uL (ref 150–400)
RBC: 5.01 MIL/uL (ref 4.22–5.81)
RDW: 13.8 % (ref 11.5–15.5)
WBC: 10.3 10*3/uL (ref 4.0–10.5)

## 2013-03-03 LAB — TROPONIN I: Troponin I: 5.78 ng/mL (ref <0.30)

## 2013-03-03 MED ORDER — TICAGRELOR 90 MG PO TABS: 90.0000 mg | ORAL_TABLET | Freq: Two times a day (BID) | ORAL | Status: DC

## 2013-03-03 MED ORDER — ASPIRIN 81 MG PO TBEC: 81.0000 mg | DELAYED_RELEASE_TABLET | Freq: Every day | ORAL | Status: DC

## 2013-03-03 MED ORDER — NICOTINE 7 MG/24HR TD PT24: 7.0000 mg | MEDICATED_PATCH | Freq: Every day | TRANSDERMAL | Status: DC

## 2013-03-03 MED ORDER — ATORVASTATIN CALCIUM 80 MG PO TABS: 80.0000 mg | ORAL_TABLET | Freq: Every day | ORAL | Status: DC

## 2013-03-03 MED ORDER — CARVEDILOL 12.5 MG PO TABS: 12.5000 mg | ORAL_TABLET | Freq: Two times a day (BID) | ORAL | Status: DC

## 2013-03-03 MED ORDER — ALPRAZOLAM 0.25 MG PO TABS: 0.2500 mg | ORAL_TABLET | Freq: Three times a day (TID) | ORAL | Status: DC | PRN

## 2013-03-03 MED ORDER — NITROGLYCERIN 0.4 MG SL SUBL: 0.4000 mg | SUBLINGUAL_TABLET | SUBLINGUAL | Status: DC | PRN

## 2013-03-03 NOTE — Discharge Summary (Signed)
Physician Discharge Summary       Patient ID: Louis Bruce MRN: [id number] DOB/AGE: [birth year] 53 y.o.  Admit date: 02/27/2013 Discharge date: 03/03/2013  Discharge Diagnoses:  Principal Problem:   STEMI of inferior wall 02/27/13- RCA DES X 2 Active Problems:   Hypokalemia resolved   HTN (hypertension)   CAD - residual LAD disease 50% stenosis   Dyslipidemia   Smoker   Family history of early CAD   Presence of drug coated stent in right coronary artery, 02/27/13   Discharged Condition: good  Procedures: emergent cath and PCI with des stent to RCA with STEMI of inf. Wall by Dr. Herbie Baltimore 02/27/13  Follow up cath for possible FFR of LAD with stenosis by Dr. Allyson Sabal 03/02/13  Hospital Course: 52 year old male presented emergently to the cath lab by EMS with STEMI, inf wall ST elevation 9 mm elevation. Pain began at 1215 today. EMS called and pt given 4 baby ASA and IV morphine.   Pt has not seen MD in many years. Hx of elevated triglycerides. + tobacco use.  He was brought emergently to cath lab for cath and possible PCI.  Cath revealed 100% occluded RCA and PTCA and placement of 2 DES Xience Alpine stents were placed.  His LAD had 70 % stenosis.  Pt was admitted to CCU and angiomax was continued.  BB and statin therapy were started.  K+ was 3.3 and this was replaced.   Troponin pked at >20 and was down to 5 at discharge today.  He is a smoker, pt placed on Nicoderm patch and xanax was ordered.  Plans for repeat cath on Monday to re eval LAD with possible FFR.  Pt continued to do well and second cath revealed mid LAD of 50%.  Pt was kept overnight, cardiac rehab walked and he did well.  Dr. Herbie Baltimore saw and evaluated him and found him stable for discharge.     Pt wishes to go out of town the first of Jan.  Dr. Herbie Baltimore wants him seen in the office Jan 2nd and have an echo prior to that time.  He will be in the office along with [facility]. PAC for both of them to see.  Plan to decide  when to return to work at that time.  ACE will be added as an outpt as BP allows.  Consults: None  Significant Diagnostic Studies:  BMET    Component Value Date/Time   NA 138 03/03/2013 0925   K 4.5 03/03/2013 0925   CL 103 03/03/2013 0925   CO2 26 03/03/2013 0925   GLUCOSE 94 03/03/2013 0925   BUN 16 03/03/2013 0925   CREATININE 0.96 03/03/2013 0925   CALCIUM 9.4 03/03/2013 0925   GFRNONAA >90 03/03/2013 0925   GFRAA >90 03/03/2013 0925    CBC    Component Value Date/Time   WBC 10.3 03/03/2013 0925   RBC 5.01 03/03/2013 0925   HGB 15.7 03/03/2013 0925   HCT 45.8 03/03/2013 0925   PLT 240 03/03/2013 0925   MCV 91.4 03/03/2013 0925   MCH 31.3 03/03/2013 0925   MCHC 34.3 03/03/2013 0925   RDW 13.8 03/03/2013 0925   LYMPHSABS 3.4 03/02/2013 0535   MONOABS 1.2* 03/02/2013 0535   EOSABS 0.2 03/02/2013 0535   BASOSABS 0.0 03/02/2013 0535    Troponin pk >20  HgB A1C 5.7 TSH 1.416   PCXR: FINDINGS: Mediastinum and hilar structures are normal. Mild cardiomegaly and pulmonary venous congestion is present. No overt  pulmonary edema. No focal infiltrate. Mild atelectasis left lung base. No pleural effusion. No pneumothorax. No acute bony abnormality.  IMPRESSION: Mild cardiomegaly and pulmonary venous congestion. No overt pulmonary edema.    Discharge Exam: Blood pressure 108/77, pulse 64, temperature 98.3 F (36.8 C), temperature source Oral, resp. rate 15, height 5\' 9"  (1.753 m), weight 256 lb 12.8 oz (116.484 kg), SpO2 95.00%.  AM exam:PE: per MD  General:Pleasant affect, NAD  Skin:Warm and dry, brisk capillary refill  HEENT:normocephalic, sclera clear, mucus membranes moist  Neck:supple, no JVD, no bruits  Heart:S1S2 RRR without murmur, gallup, rub or click  Lungs:clear without rales, rhonchi, or wheezes  ZOX:WRUE, non tender, + BS, do not palpate liver spleen or masses  Ext:no lower ext edema, 2+ pedal pulses, 2+ radial pulses  Neuro:alert and oriented, MAE,  follows commands, + facial symmetry  Disposition: 01-Home or Self Care      Discharge Orders   Future Appointments Provider Department Dept Phone   03/13/2013 10:00 AM Eda Paschal Girard, PA-C [facility] [phone]   Future Orders Complete By Expires   Amb Referral to Cardiac Rehabilitation  As directed        Medication List         ALPRAZolam 0.25 MG tablet  Commonly known as:  XANAX  Take 1 tablet (0.25 mg total) by mouth 3 (three) times daily as needed for anxiety.     aspirin 81 MG EC tablet  Take 1 tablet (81 mg total) by mouth daily.     atorvastatin 80 MG tablet  Commonly known as:  LIPITOR  Take 1 tablet (80 mg total) by mouth daily at 6 PM.     carvedilol 12.5 MG tablet  Commonly known as:  COREG  Take 1 tablet (12.5 mg total) by mouth 2 (two) times daily with a meal.     nicotine 7 mg/24hr patch  Commonly known as:  NICODERM CQ - dosed in mg/24 hr  Place 1 patch (7 mg total) onto the skin daily.     nitroGLYCERIN 0.4 MG SL tablet  Commonly known as:  NITROSTAT  Place 1 tablet (0.4 mg total) under the tongue every 5 (five) minutes x 3 doses as needed for chest pain.     Ticagrelor 90 MG Tabs tablet  Commonly known as:  BRILINTA  Take 1 tablet (90 mg total) by mouth 2 (two) times daily.       Follow-up Information   Follow up with Abelino Derrick, PA-C On 03/13/2013. (at 10:00 am)    Specialty:  Cardiology   Contact information:   [address] [place] [address] [phone]        Discharge Instructions: Take 1 NTG, under your tongue, while sitting.  If no relief of pain may repeat NTG, one tab every 5 minutes up to 3 tablets total over 15 minutes.  If no relief CALL 911.  If you have dizziness/lightheadness  while taking NTG, stop taking and call 911.        Call [facility] at [phone] if any bleeding, swelling or drainage at cath site.  May shower, no tub baths for 48 hours for groin sticks.   No  lifting over 5 pounds for 2 weeks.  No driving until seen in office.  No work until seen in follow up.  Heart Healthy diet.  DO NOT STOP BRILINTA- stopping could cause a heart attack.  Do not smoke while wearing nicoderm patch  Call  if any chest pain or shortness of breath We will schedule for echo of heart- (Ultrasound) the week of your appt. The office will call with date and time  Signed: Carylon Tamburro R Nurse Practitioner-Certified [facility]: [facility] 03/03/2013, 6:30 PM  Time spent on discharge :45 minutes.

## 2013-03-03 NOTE — Discharge Summary (Signed)
Admitted with Inferior STEMI - PCI to dRCA; relook cath to evaluate early mid LAD stenosis seen on initial angiography - ? Possible spasm as the lesion was no longer present.  No HF, No further angina.  Has been ambulating without difficulty.    Stable for discharge.  F/u appt as scheduled.  Marykay Lex, MD

## 2013-03-03 NOTE — Progress Notes (Signed)
Subjective: Up in room, no complaints.  Objective: Vital signs in last 24 hours: Temp:  [98.3 F (36.8 C)-98.4 F (36.9 C)] 98.3 F (36.8 C) (12/23 0753) Pulse Rate:  [58-80] 64 (12/23 0753) Resp:  [9-28] 15 (12/23 0753) BP: (94-138)/(56-91) 108/77 mmHg (12/23 0753) SpO2:  [92 %-100 %] 95 % (12/23 0753) Arterial Line BP: (111-149)/(64-89) 124/70 mmHg (12/22 2100) Weight change:  Last BM Date: 03/02/13 Intake/Output from previous day: +326 12/22 0701 - 12/23 0700 In: 1226.3 [P.O.:720; I.V.:506.3] Out: 900 [Urine:900] Intake/Output this shift:    PE: per MD General:Pleasant affect, NAD Skin:Warm and dry, brisk capillary refill HEENT:normocephalic, sclera clear, mucus membranes moist Neck:supple, no JVD, no bruits  Heart:S1S2 RRR without murmur, gallup, rub or click Lungs:clear without rales, rhonchi, or wheezes ZOX:WRUE, non tender, + BS, do not palpate liver spleen or masses Ext:no lower ext edema, 2+ pedal pulses, 2+ radial pulses Neuro:alert and oriented, MAE, follows commands, + facial symmetry   Lab Results:  Recent Labs  03/02/13 0535  WBC 12.2*  HGB 15.1  HCT 45.1  PLT 215   BMET  Recent Labs  03/02/13 0535  NA 135  K 3.9  CL 103  CO2 23  GLUCOSE 96  BUN 19  CREATININE 0.96  CALCIUM 8.8   No results found for this basename: TROPONINI, CK, MB,  in the last 72 hours  Lab Results  Component Value Date   CHOL 192 02/27/2013   HDL 32* 02/27/2013   LDLCALC 98 02/27/2013   TRIG 311* 02/27/2013   CHOLHDL 6.0 02/27/2013   Lab Results  Component Value Date   HGBA1C 5.7* 02/27/2013     Lab Results  Component Value Date   TSH 1.416 02/27/2013     Studies/Results: widely patent proximal LAD. It's possible that the abnormality of the LAD noted cath on the 19th in the setting of a STEMI with spasm. I cannot document that stenosis in multiple angiographic views. Continue medical therapy will be recommended. The guidewire and catheter  were removed. The sheath was secured. Ill-appearing remove 2 hours after Intimax was discontinued and pressure will be held. He'll be hydrated overnight and discharged home in the morning on dual antiplatelet therapy.  Medications: I have reviewed the patient's current medications. Scheduled Meds: . aspirin EC  81 mg Oral Daily  . atorvastatin  80 mg Oral q1800  . carvedilol  12.5 mg Oral BID WC  . nicotine  7 mg Transdermal Daily  . Ticagrelor  90 mg Oral BID   Continuous Infusions:  PRN Meds:.acetaminophen, ALPRAZolam, morphine injection, nitroGLYCERIN, ondansetron (ZOFRAN) IV, oxyCODONE, zolpidem  Assessment/Plan: Principal Problem:   STEMI of inferior wall 02/27/13- RCA DES X 2 Active Problems:   HTN (hypertension)   CAD - residual LAD disease   Dyslipidemia   Smoker   Family history of early CAD   PLAN: EF 50%, stable but low VS.  So will hold ACE for now and begin in the office.  Will get echo as outpt. Check labs here. Pt walking with rehab without problems. No driving for 2 weeks No work for 2 weeks Continue nicoderm patches to prevent smoking  Has 30 day Brilinta card.  Will follow up with [facility] on the 2nd and Dr. Herbie Baltimore later in the month after echo.  LOS: 4 days    [employer] [facility] R  Nurse Practitioner Certified Pager [phone] or after 5pm and on weekends call [phone] 03/03/2013, 8:58 AM  He  looks, and feels great day for post inferior STEMI. He had his followup catheterization to investigate the LAD lesion yesterday. Multiple images failed to reveal the stenosis noted on the diagnostic imaging prior to PCI. He is not having active symptoms. Groin site and right radial site CDI.  Beta blocker dose was titrated up, but not enough blood pressure and add ACE inhibitor. His EF is relatively well-preserved. Can check it prior to followup which we planned for January 2 with [facility], PA.  He is on dual antiplatelet therapy, I did spend quite some time calcium the  importance of continued use of Brilinta. He received a card in the first month free.  He is also on atorvastatin 80 mg daily, I would likely did be this after discharge, would continue the current dose for now. He sees a doing quite well with Nicoderm patch, with no significant cravings. We will prescribe patches for him on discharge and continue to admonish him to quit smoking.  He has done well in the cardiac rehabilitation. Anticipate discharge today as discussed. I also spent some time counseling on the importance of smoking cessation (least 4 minutes. Post friend. I also spent quite some time calcium reports a medication adherence especially Brilinta. We also been discussed as his level of activity and intensity of activity in the next 4-6 weeks post STEMI. Family members insinuated they will work to keep him from restarting at least a shallow portion of his working until after the end of January.. Will make further decisions when seen in followup.  In the interim to assess his baseline post MI EF.  Marykay Lex, M.D., M.S. [facility] [address] [place], [place]  [address]  [phone] Pager # [phone] 03/03/2013 12:41 PM

## 2013-03-03 NOTE — Progress Notes (Signed)
Sheath removed from pt's right femoral artery at 2110. Manual pressure held for 30 minutes with assistance from Jorge Ny, Charity fundraiser. Vital signs remained stable throughout procedure. Pt tolerated procedure well with no complaints of pain. Distal pulses +2; extremities warm, pink and dry with capillary refill <3 seconds. Pressure dressing applied at 2145. Pt given instructions on 6-hr bedrest ie: keeping right leg straight and not bending at the hip for 6 hours. Pt also educated about holding pressure at site should the need arise to laugh, cough, sneeze, etc. Will continue to monitor.

## 2013-03-04 ENCOUNTER — Telehealth (HOSPITAL_COMMUNITY): Payer: Self-pay | Admitting: *Deleted

## 2013-03-08 LAB — CULTURE, BLOOD (ROUTINE X 2)
Culture: NO GROWTH
Culture: NO GROWTH

## 2013-03-13 ENCOUNTER — Ambulatory Visit (INDEPENDENT_AMBULATORY_CARE_PROVIDER_SITE_OTHER): Payer: 59 | Admitting: Cardiology

## 2013-03-13 ENCOUNTER — Encounter: Payer: Self-pay | Admitting: Cardiology

## 2013-03-13 VITALS — BP 90/70 | HR 60 | Ht 70.0 in | Wt 263.7 lb

## 2013-03-13 DIAGNOSIS — F17200 Nicotine dependence, unspecified, uncomplicated: Secondary | ICD-10-CM

## 2013-03-13 DIAGNOSIS — I2119 ST elevation (STEMI) myocardial infarction involving other coronary artery of inferior wall: Secondary | ICD-10-CM

## 2013-03-13 DIAGNOSIS — I2510 Atherosclerotic heart disease of native coronary artery without angina pectoris: Secondary | ICD-10-CM

## 2013-03-13 DIAGNOSIS — Z8249 Family history of ischemic heart disease and other diseases of the circulatory system: Secondary | ICD-10-CM

## 2013-03-13 DIAGNOSIS — I10 Essential (primary) hypertension: Secondary | ICD-10-CM

## 2013-03-13 DIAGNOSIS — E785 Hyperlipidemia, unspecified: Secondary | ICD-10-CM

## 2013-03-13 NOTE — Assessment & Plan Note (Signed)
Medi-calRx

## 2013-03-13 NOTE — Assessment & Plan Note (Signed)
On statin.

## 2013-03-13 NOTE — Assessment & Plan Note (Signed)
Low B/P today, no ACE at this time

## 2013-03-13 NOTE — Patient Instructions (Addendum)
Your physician recommends that you schedule a follow-up appointment in: 6 weeks with Dr Ellyn Hack.  You can ease back into your usual routine in a week, no strenuous activity X 6 weeks.  Your physician has requested that you have an echocardiogram. Echocardiography is a painless test that uses sound waves to create images of your heart. It provides your doctor with information about the size and shape of your heart and how well your heart's chambers and valves are working. This procedure takes approximately one hour. There are no restrictions for this procedure.  Patient is ok to fly

## 2013-03-13 NOTE — Assessment & Plan Note (Signed)
No further angina

## 2013-03-13 NOTE — Progress Notes (Signed)
03/13/2013 Webb Laws Laveda Abbe   05/27/1960  [id number]  Primary Physicia Leonides Grills, MD Primary Cardiologist: Dr Ellyn Hack  HPI:  53 year old male, smoker, (type A personality), who is a Chief Strategy Officer. He had not seen am MD for years. He presented 02/27/13 as an inferior  STEMI.  Cath revealed 100% occluded RCA. He had a DES placed and di well post MI. He did have a residual 70% LAD. Restudy prior to discharge showed this to be 50% and the plan is for medical therapy. He is here to day for post hospital follow up. He has not had chest pain. He has been researching heart healthy diet information. He has not smoked. He has ben walking but not doing anything strenuous. He doesn't think he can work cardiac rehab into his busy schedule. He asked about flying to [place] in a week or so for business.   Current Outpatient Prescriptions  Medication Sig Dispense Refill  . ALPRAZolam (XANAX) 0.25 MG tablet Take 1 tablet (0.25 mg total) by mouth 3 (three) times daily as needed for anxiety.  60 tablet  0  . aspirin EC 81 MG EC tablet Take 1 tablet (81 mg total) by mouth daily.      Marland Kitchen atorvastatin (LIPITOR) 80 MG tablet Take 1 tablet (80 mg total) by mouth daily at 6 PM.  30 tablet  11  . carvedilol (COREG) 12.5 MG tablet Take 1 tablet (12.5 mg total) by mouth 2 (two) times daily with a meal.  60 tablet  6  . nicotine (NICODERM CQ - DOSED IN MG/24 HR) 7 mg/24hr patch Place 1 patch (7 mg total) onto the skin daily.  28 patch  1  . nitroGLYCERIN (NITROSTAT) 0.4 MG SL tablet Place 1 tablet (0.4 mg total) under the tongue every 5 (five) minutes x 3 doses as needed for chest pain.  25 tablet  4  . Ticagrelor (BRILINTA) 90 MG TABS tablet Take 1 tablet (90 mg total) by mouth 2 (two) times daily.  60 tablet  11   No current facility-administered medications for this visit.    No Known Allergies  History   Social History  . Marital Status: Married    Spouse Name: N/A    Number of Children: N/A  . Years of Education:  N/A   Occupational History  . Not on file.   Social History Main Topics  . Smoking status: Former Smoker -- 1.00 packs/day for 35 years    Quit date: 02/27/2013  . Smokeless tobacco: Never Used  . Alcohol Use: 6.0 oz/week    10 Shots of liquor per week  . Drug Use: No  . Sexual Activity: Yes   Other Topics Concern  . Not on file   Social History Narrative  . No narrative on file     Review of Systems: General: negative for chills, fever, night sweats or weight changes.  Cardiovascular: negative for chest pain, dyspnea on exertion, edema, orthopnea, palpitations, paroxysmal nocturnal dyspnea or shortness of breath Dermatological: negative for rash Respiratory: negative for cough or wheezing Urologic: negative for hematuria Abdominal: negative for nausea, vomiting, diarrhea, bright red blood per rectum, melena, or hematemesis Neurologic: negative for visual changes, syncope, or dizziness All other systems reviewed and are otherwise negative except as noted above.    Blood pressure 90/70, pulse 60, height 5\' 10"  (1.778 m), weight 263 lb 11.2 oz (119.614 kg).  General appearance: alert, cooperative, no distress and moderately obese Lungs: clear to auscultation bilaterally Heart: regular  rate and rhythm  EKG NSR, inferior Qs and TWI  ASSESSMENT AND PLAN:   STEMI of inferior wall 02/27/13- RCA DES X 2 No further angina  CAD - residual LAD disease 50% stenosis Medical Rx  Dyslipidemia On statin  Smoker He has quit  HTN (hypertension) Low B/P today, no ACE at this time   PLAN For now continue current medical Rx. I did order an echo for LVF. Dr Ellyn Hack said it's OK for him to fly in a week. I encouraged the patient to gradually increase his activity but that we did not want him doing anything strenuous for at least 6 weeks. He' see Dr Ellyn Hack in 6 weeks. His B/P was on the low side but he has not had symptoms. I did not add an ACE.  Emalia Witkop KPA-C 03/13/2013 1:02  PM

## 2013-03-13 NOTE — Assessment & Plan Note (Signed)
He has quit

## 2013-03-16 ENCOUNTER — Ambulatory Visit (HOSPITAL_COMMUNITY)
Admission: RE | Admit: 2013-03-16 | Discharge: 2013-03-16 | Disposition: A | Discharge status: Home / Self Care | Payer: 59 | Source: Ambulatory Visit | Attending: Cardiovascular Disease | Admitting: Cardiovascular Disease

## 2013-03-16 DIAGNOSIS — I2510 Atherosclerotic heart disease of native coronary artery without angina pectoris: Secondary | ICD-10-CM

## 2013-03-16 DIAGNOSIS — Z8249 Family history of ischemic heart disease and other diseases of the circulatory system: Secondary | ICD-10-CM

## 2013-03-16 DIAGNOSIS — E785 Hyperlipidemia, unspecified: Secondary | ICD-10-CM | POA: Insufficient documentation

## 2013-03-16 DIAGNOSIS — F17200 Nicotine dependence, unspecified, uncomplicated: Secondary | ICD-10-CM | POA: Insufficient documentation

## 2013-03-16 DIAGNOSIS — I2589 Other forms of chronic ischemic heart disease: Secondary | ICD-10-CM | POA: Insufficient documentation

## 2013-03-16 DIAGNOSIS — I252 Old myocardial infarction: Secondary | ICD-10-CM | POA: Insufficient documentation

## 2013-03-16 NOTE — Progress Notes (Signed)
2D Echo Performed 03/16/2013    Marygrace Drought, RCS

## 2013-04-02 ENCOUNTER — Telehealth: Payer: Self-pay | Admitting: *Deleted

## 2013-04-02 ENCOUNTER — Telehealth: Payer: Self-pay | Admitting: Cardiology

## 2013-04-02 ENCOUNTER — Other Ambulatory Visit: Payer: Self-pay | Admitting: *Deleted

## 2013-04-02 MED ORDER — TICAGRELOR 90 MG PO TABS: 90.0000 mg | ORAL_TABLET | Freq: Two times a day (BID) | ORAL | Status: DC

## 2013-04-02 NOTE — Telephone Encounter (Signed)
Message forwarded to [place], [place].

## 2013-04-02 NOTE — Telephone Encounter (Signed)
Need prior authorization for his Brilinta Prescription please.

## 2013-04-02 NOTE — Telephone Encounter (Signed)
Fax received from [facility] requesting prior authorization for Brilinta 90mg  BID.  I called [facility] and spoke with Shanon Brow.  PA was approved for 1 year.  Form with approval was faxed to [facility].  Samples of brilinta were given to patient to ensure he didn't run out before the PA had time to go through.

## 2013-04-02 NOTE — Telephone Encounter (Signed)
Trixie Dredge, RN completed a prior authorization form and faxed this yesterday. We are now awaiting a response from the pharmacy.

## 2013-04-09 ENCOUNTER — Telehealth: Payer: Self-pay | Admitting: Cardiology

## 2013-04-09 NOTE — Telephone Encounter (Signed)
Is having shortness of breath, when he is at work and walking and have had some chest pains and tightness . Not sure if the medicine is causing this and thinks he is taking to much of his beta blocker  And bp is low .Marland Kitchen Has a appt with Pa on tomorrow .Marland Kitchen Please call would like to speak to someone .Marland Kitchen Please call at [phone]  thanks

## 2013-04-09 NOTE — Telephone Encounter (Signed)
Returned call and pt verified x 2 w/ Debbie, pt's wife.  Stated pt is having SOB x 2 weeks.  Stated he has been having breathing issues since starting Brilinta.  Stated they were told the carvedilol can also cause SOB.  Stated pt thinks his BP is too low.  RN asked what BP is and wife stated it was 50-60/80.  Wife informed that could not be possible and she stated she doesn't have the recordings w/ her.  Stated pt c/o SOB, feeling weak and having a hard time walking distance.  Stated he walks a lot w/ work and is scheduled to be seen tomorrow.  Stated she wants pt's oxygen to be checked while walking when he comes in.  RN asked where pt is and wife stated he is in [place], [place].  Stated he travels for work and is very stressed.  Wife informed pt should be seen in ER w/ symptoms described.  Also informed pt should have BP checked and needs to take NTG for chest pain/pressure/tightness.  She stated pt will not go to ER and providers here know him as "the bulldog," "type triple A personality on steroids."  Wife informed RN is not able to offer any further advice if pt is not willing to go to ER for evaluation and plans to come in tomorrow, other than to go to ER when he returns if symptoms are still present.  Verbalized understanding and agreed w/ plan.  Stated she will talk to pt.  Message forwarded to Dr. Ellyn Hack [facility]).

## 2013-04-09 NOTE — Telephone Encounter (Signed)
Returned call.  Left message to call back before 4pm.  

## 2013-04-09 NOTE — Telephone Encounter (Signed)
Convert to Effient. Cut Coreg back to 1/2 tab BID.  Leonie Man, MD

## 2013-04-10 ENCOUNTER — Ambulatory Visit (INDEPENDENT_AMBULATORY_CARE_PROVIDER_SITE_OTHER): Payer: 59 | Admitting: Cardiology

## 2013-04-10 ENCOUNTER — Encounter: Payer: Self-pay | Admitting: Cardiology

## 2013-04-10 VITALS — BP 102/70 | HR 55 | Ht 69.5 in | Wt 250.1 lb

## 2013-04-10 DIAGNOSIS — R0602 Shortness of breath: Secondary | ICD-10-CM

## 2013-04-10 DIAGNOSIS — I2119 ST elevation (STEMI) myocardial infarction involving other coronary artery of inferior wall: Secondary | ICD-10-CM

## 2013-04-10 DIAGNOSIS — F419 Anxiety disorder, unspecified: Secondary | ICD-10-CM | POA: Insufficient documentation

## 2013-04-10 DIAGNOSIS — E785 Hyperlipidemia, unspecified: Secondary | ICD-10-CM

## 2013-04-10 DIAGNOSIS — I10 Essential (primary) hypertension: Secondary | ICD-10-CM

## 2013-04-10 DIAGNOSIS — S8010XA Contusion of unspecified lower leg, initial encounter: Secondary | ICD-10-CM

## 2013-04-10 DIAGNOSIS — R5383 Other fatigue: Secondary | ICD-10-CM

## 2013-04-10 DIAGNOSIS — S8012XA Contusion of left lower leg, initial encounter: Secondary | ICD-10-CM

## 2013-04-10 DIAGNOSIS — I2510 Atherosclerotic heart disease of native coronary artery without angina pectoris: Secondary | ICD-10-CM

## 2013-04-10 DIAGNOSIS — F411 Generalized anxiety disorder: Secondary | ICD-10-CM

## 2013-04-10 DIAGNOSIS — F17200 Nicotine dependence, unspecified, uncomplicated: Secondary | ICD-10-CM

## 2013-04-10 DIAGNOSIS — R5381 Other malaise: Secondary | ICD-10-CM

## 2013-04-10 MED ORDER — CARVEDILOL 12.5 MG PO TABS: 6.2500 mg | ORAL_TABLET | Freq: Two times a day (BID) | ORAL | Status: DC

## 2013-04-10 NOTE — Patient Instructions (Signed)
Decrease carvedilol to 6.25 mg twice a day half of the 12.5 mg tabs.  Monitor BP if greater than 120/80 or HR greater than 75 call us.  Stop Brilinta and begin Effient once daily    Follow up with Dr. Ellyn Hack   Keep up the good work, but do not push too hard.  You are recovering from a heart attack.

## 2013-04-10 NOTE — Assessment & Plan Note (Signed)
occ brief shooting discomfort.

## 2013-04-10 NOTE — Assessment & Plan Note (Signed)
Pt with recent STEMI of inf wall with placement of DES to RCA.  Echo done this month with EF 50% but BP borderline so no ACE added.  He drove back from [place] today in snow the whole way due to continued SOB and fatigue.  He cannot seem to get his energy back.  There are some phone calls that address this as well.  For now change Brilinta to Effient.  He will take tonights dose of Brilinta then change to Effient tomorrow.  I also decreased his coreg to 6.25 mg BID to give him more HR and BP.  He will call if BP > 120/80 consistently.  He is to see Dr. Ellyn Hack back on 04/28/13.

## 2013-04-10 NOTE — Assessment & Plan Note (Signed)
controlled 

## 2013-04-10 NOTE — Assessment & Plan Note (Signed)
OK to use 2 of alprazolam 0.25 mg if needed, instead of just one.

## 2013-04-10 NOTE — Assessment & Plan Note (Signed)
See above

## 2013-04-10 NOTE — Assessment & Plan Note (Signed)
Calf is swollen and bruised but no tenderness except on raised area on shin.  Bruising at the ankle from gravity of the hematoma

## 2013-04-10 NOTE — Assessment & Plan Note (Signed)
treated

## 2013-04-10 NOTE — Progress Notes (Signed)
04/10/2013   PCP: Leonides Grills, MD   Chief Complaint  Patient presents with  . SOB, CHEST TIGHTNESS, FATIQUE    Primary Cardiologist: Dr. Ellyn Hack  HPI:  53 year old [place] with Hx on 02/27/13 of an inferior STEMI. Cath revealed 100% occluded RCA. He had a DES placed and did well post MI. He did have a residual 70% LAD. Restudy prior to discharge showed this to be 50% and the plan is for medical therapy.  He is driven to perform with work and recovery of MI.  He has been researching heart healthy diet information. He has not smoked. He has been walking but not doing anything strenuous. He doesn't think he can work cardiac rehab into his busy schedule.He had Echo done 03/16/13 with EF of 50%.   He was cleared to return to work.  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  He drove back today to be seen in the snow secondary to SOB with talking fatigue and occ mild brief chest pain.  He previously had called and it was recommended to go to Effient from [facility] for the SOB but pt did not receive message or understand.  He states his energy is not returning and systolic BP at home is freq in the 90s.  His SOB is with some exertion but even with talking at the meetings he has to stop due to SOB.  He is frustrated with this.  He has stopped tobacco, using nicoderm and is planning to decrease dose. Of the nicoderm.  He uses the Xanax on the plane or to sleep but finds one pill does not do much.  I instructed him he could take 2 of the 0.25 mg tabs.    Pt is too see Dr. Ellyn Hack on the 17th of Feb. But he did not think he could wait that long.      No Known Allergies  Current Outpatient Prescriptions  Medication Sig Dispense Refill  . ALPRAZolam (XANAX) 0.25 MG tablet Take 1 tablet (0.25 mg total) by mouth 3 (three) times daily as needed for anxiety.  60 tablet  0  . aspirin EC 81 MG EC tablet Take 1 tablet (81 mg total) by mouth  daily.      Marland Kitchen atorvastatin (LIPITOR) 80 MG tablet Take 1 tablet (80 mg total) by mouth daily at 6 PM.  30 tablet  11  . carvedilol (COREG) 12.5 MG tablet Take 0.5 tablets (6.25 mg total) by mouth 2 (two) times daily with a meal.  60 tablet  6  . nicotine (NICODERM CQ - DOSED IN MG/24 HR) 7 mg/24hr patch Place 1 patch (7 mg total) onto the skin daily.  28 patch  1  . nitroGLYCERIN (NITROSTAT) 0.4 MG SL tablet Place 1 tablet (0.4 mg total) under the tongue every 5 (five) minutes x 3 doses as needed for chest pain.  25 tablet  4   No current facility-administered medications for this visit.    Past Medical History  Diagnosis Date  . High triglycerides     in the past  . HTN (hypertension)     treated in the past  . Myocardial infarction, inferior wall 02/2013    treated with stent to RCA  . CAD (coronary artery disease) 02/2013    STEMI of inf. wall with Stent to RCA and residual LAD disease  . Tobacco use   .  Anxiety 04/10/2013    Past Surgical History  Procedure Laterality Date  . Appendectomy    . Inguinal hernia repair    . Coronary angioplasty with stent placement  02/2013    LKG:MWNUUVO:ZD colds or fevers,  weight down from 263.11 to 250.1 lbs. Skin:no rashes or ulcers HEENT:no blurred vision, no congestion CV:see HPI PUL:see HPI GI:occ diarrhea/constipation no melena, no indigestion GU:no hematuria, no dysuria MS:no joint pain, no claudication, hematoma from bump to the leg Neuro:no syncope, no lightheadedness Endo:no diabetes, no thyroid disease  PHYSICAL EXAM BP 102/70  Pulse 55  Ht 5' 9.5" (1.765 m)  Wt 250 lb 1.6 oz (113.445 kg)  BMI 36.42 kg/m2 General:Pleasant affect, NAD Skin:Warm and dry, brisk capillary refill HEENT:normocephalic, sclera clear, mucus membranes moist Neck:supple, no JVD, no bruits  Heart:S1S2 RRR without murmur, gallup, rub or click Lungs:clear without rales, rhonchi, or wheezes GUY:QIHK, non tender, + BS, do not palpate liver spleen or  masses Ext:no lower ext edema, Lt leg with hematoma after bumping leg on shin, now with bruising at ankle. Rt. Leg without edema,. 2+ pedal pulses, 2+ radial pulses Neuro:alert and oriented, MAE, follows commands, + facial symmetry  EKG:S brady at 55, no changes from 03/13/13 with T wave inversions of Inf MI   ASSESSMENT AND PLAN SOB (shortness of breath) Pt with recent STEMI of inf wall with placement of DES to RCA.  Echo done this month with EF 50% but BP borderline so no ACE added.  He drove back from [place] today in snow the whole way due to continued SOB and fatigue.  He cannot seem to get his energy back.  There are some phone calls that address this as well.  For now change Brilinta to Effient.  He will take tonights dose of Brilinta then change to Effient tomorrow.  I also decreased his coreg to 6.25 mg BID to give him more HR and BP.  He will call if BP > 120/80 consistently.  He is to see Dr. Ellyn Hack back on 04/28/13.    Fatigue See above  STEMI of inferior wall 02/27/13- RCA DES X 2 occ brief shooting discomfort.  Smoker Using nicoderm patch and has not smoked, I congratulated him.  HTN (hypertension) controlled  Dyslipidemia treated  Anxiety OK to use 2 of alprazolam 0.25 mg if needed, instead of just one.    Traumatic hematoma of left lower leg, on shin Calf is swollen and bruised but no tenderness except on raised area on shin.  Bruising at the ankle from gravity of the hematoma    5 weeks worth of samples given of effient.

## 2013-04-10 NOTE — Assessment & Plan Note (Signed)
Using nicoderm patch and has not smoked, I congratulated him.

## 2013-04-14 NOTE — Telephone Encounter (Signed)
Pt had OV on 1.30.15 w/ Cecilie Kicks, NP.

## 2013-04-27 ENCOUNTER — Other Ambulatory Visit: Payer: Self-pay | Admitting: Nurse Practitioner

## 2013-04-27 ENCOUNTER — Telehealth: Payer: Self-pay | Admitting: *Deleted

## 2013-04-27 ENCOUNTER — Telehealth: Payer: Self-pay | Admitting: Nurse Practitioner

## 2013-04-27 MED ORDER — PRASUGREL HCL 10 MG PO TABS: 10.0000 mg | ORAL_TABLET | Freq: Every day | ORAL | Status: DC

## 2013-04-27 NOTE — Telephone Encounter (Signed)
Pt called b/c he is due to go out of town tomorrow and doesn't have an active Rx for [place].  He was just switched from brilinta to effient in the setting of dyspnea on brilinta and feels much better.  He was given effient samples and now needs a rx.  I sent in a Rx for effient 10mg  1 po daily, #90, 3 refills to his pharmacy in [place].  I rec that he call them and get over there asap to pick it up due to pending inclement weather.

## 2013-04-27 NOTE — Telephone Encounter (Signed)
Pt was called to r/s his appointment and he is unable to reschedule at this time and needs a refill of his medication. He needs Effient and Lipitor and Xanax. Pt is leaving to go back out of town for work on Wednesday.   [place]

## 2013-04-28 ENCOUNTER — Ambulatory Visit: Payer: 59 | Admitting: Cardiology

## 2013-04-29 NOTE — Telephone Encounter (Signed)
Returned call and pt verified x 2.  Pt stated he talked w/o someone the other day and script was sent in.  No refills needed per pt.  Reviewed chart and Sharolyn Douglas, NP sent in Rx for Effient.

## 2013-06-03 ENCOUNTER — Other Ambulatory Visit: Payer: Self-pay

## 2013-06-03 MED ORDER — ATORVASTATIN CALCIUM 80 MG PO TABS: 80.0000 mg | ORAL_TABLET | Freq: Every day | ORAL | Status: DC

## 2013-06-03 MED ORDER — PRASUGREL HCL 10 MG PO TABS: 10.0000 mg | ORAL_TABLET | Freq: Every day | ORAL | Status: DC

## 2013-06-03 MED ORDER — CARVEDILOL 12.5 MG PO TABS: 6.2500 mg | ORAL_TABLET | Freq: Two times a day (BID) | ORAL | Status: DC

## 2013-06-03 NOTE — Telephone Encounter (Signed)
Prescriptions for Atorvastatin and Carvedilol sent to pharmacy electronically.  Alprazolam routed to Dr Roni Bread to approve.

## 2013-06-04 ENCOUNTER — Other Ambulatory Visit: Payer: Self-pay | Admitting: Cardiology

## 2013-06-04 MED ORDER — CARVEDILOL 12.5 MG PO TABS: 6.2500 mg | ORAL_TABLET | Freq: Two times a day (BID) | ORAL | Status: DC

## 2013-06-04 MED ORDER — ATORVASTATIN CALCIUM 80 MG PO TABS: 80.0000 mg | ORAL_TABLET | Freq: Every day | ORAL | Status: DC

## 2013-06-04 NOTE — Telephone Encounter (Signed)
Unfortunately, I cannot provide a Rx for Xanax on a patient that I have not seen in ~3 months.  This is simply not allowed by the DEA.  If he is having significant anxiety, I recommend he see his PCP or schedule a work-in appt with one of our APPs here @ [facility].     Thanks, DH Leonie Man, MD

## 2013-06-05 ENCOUNTER — Other Ambulatory Visit: Payer: Self-pay | Admitting: *Deleted

## 2013-08-10 ENCOUNTER — Telehealth: Payer: Self-pay | Admitting: Cardiology

## 2013-08-10 NOTE — Telephone Encounter (Signed)
[place] for routine cleaning, do not stop effient.  No antibiotics.

## 2013-08-10 NOTE — Telephone Encounter (Signed)
PATIENT NOTIFIED.  AND AWARE APPOINTMENT IS NEEDED.  HIS PREVIOUS APPOINTMENT WAS CANCELLED DUE TO A SNOW DAY.

## 2013-08-10 NOTE — Telephone Encounter (Signed)
Pt is going to having a dental assessment on Thursday. The dentist told him to call and see if he needed an antibiotic.

## 2013-08-10 NOTE — Telephone Encounter (Signed)
WILL DEFER TO  Cecilie Kicks NP The last office visit was with [place]  NP [place] 2015.  ALSO WHEN DOES THE PATIENT NEED TO SEE  DR HARDING( NO INDICATION GIVEN AT LAST VISIT0

## 2013-08-13 ENCOUNTER — Telehealth: Payer: Self-pay | Admitting: Cardiology

## 2013-08-13 NOTE — Telephone Encounter (Signed)
Closed encounter °

## 2013-09-30 ENCOUNTER — Telehealth: Payer: Self-pay | Admitting: Cardiology

## 2013-09-30 NOTE — Telephone Encounter (Signed)
I called the pt.s dental office and had them fax over the form again, I also talked to the pt. And let him know we were working on this issue

## 2013-09-30 NOTE — Telephone Encounter (Signed)
Please call after 2:00 please.

## 2013-09-30 NOTE — Telephone Encounter (Signed)
Pt need to have dental procedure asap. His dentist said he sent an urgent request for clarence and he still have not received it. Pt has an appt with Dr Ellyn Hack on Monday.

## 2013-09-30 NOTE — Telephone Encounter (Signed)
Had Dr. Debara Pickett review clearance request for Louis Bruce to have oral surgery which is scheduled for tomorrow. He can not stop Effient had stent placed in [place] with Mitzi Hansen at the office of [employer], [facility] -they are going to do a local prior to his procedure and will continue as planned on Effient.  FYI sent to Dr. Debara Pickett.

## 2013-09-30 NOTE — Telephone Encounter (Signed)
Clearance letter sent

## 2013-10-05 ENCOUNTER — Ambulatory Visit (INDEPENDENT_AMBULATORY_CARE_PROVIDER_SITE_OTHER): Payer: 59 | Admitting: Cardiology

## 2013-10-05 ENCOUNTER — Encounter: Payer: Self-pay | Admitting: Cardiology

## 2013-10-05 VITALS — BP 124/92 | HR 62 | Ht 69.5 in | Wt 258.4 lb

## 2013-10-05 DIAGNOSIS — IMO0001 Reserved for inherently not codable concepts without codable children: Secondary | ICD-10-CM

## 2013-10-05 DIAGNOSIS — M25569 Pain in unspecified knee: Secondary | ICD-10-CM

## 2013-10-05 DIAGNOSIS — Z79899 Other long term (current) drug therapy: Secondary | ICD-10-CM

## 2013-10-05 DIAGNOSIS — R5381 Other malaise: Secondary | ICD-10-CM

## 2013-10-05 DIAGNOSIS — Z9861 Coronary angioplasty status: Secondary | ICD-10-CM

## 2013-10-05 DIAGNOSIS — F17200 Nicotine dependence, unspecified, uncomplicated: Secondary | ICD-10-CM

## 2013-10-05 DIAGNOSIS — R5382 Chronic fatigue, unspecified: Secondary | ICD-10-CM

## 2013-10-05 DIAGNOSIS — I10 Essential (primary) hypertension: Secondary | ICD-10-CM

## 2013-10-05 DIAGNOSIS — M25562 Pain in left knee: Secondary | ICD-10-CM

## 2013-10-05 DIAGNOSIS — E785 Hyperlipidemia, unspecified: Secondary | ICD-10-CM

## 2013-10-05 DIAGNOSIS — I2510 Atherosclerotic heart disease of native coronary artery without angina pectoris: Secondary | ICD-10-CM

## 2013-10-05 DIAGNOSIS — E669 Obesity, unspecified: Secondary | ICD-10-CM

## 2013-10-05 DIAGNOSIS — M791 Myalgia, unspecified site: Secondary | ICD-10-CM

## 2013-10-05 DIAGNOSIS — Z955 Presence of coronary angioplasty implant and graft: Secondary | ICD-10-CM

## 2013-10-05 DIAGNOSIS — R5383 Other fatigue: Secondary | ICD-10-CM

## 2013-10-05 NOTE — Patient Instructions (Addendum)
STOP  Lipitor ( atorvastatin ) for 1 month, will see how your labs are.  Labs- CBC, TSH, VIT D , CMP, LIPID   You have been referred to  [place].  Your physician wants you to follow-up in Jan 2016 Dr Ellyn Hack.  You will receive a reminder letter in the mail two months in advance. If you don't receive a letter, please call our office to schedule the follow-up appointment.

## 2013-10-07 ENCOUNTER — Encounter: Payer: Self-pay | Admitting: Cardiology

## 2013-10-07 DIAGNOSIS — M791 Myalgia, unspecified site: Secondary | ICD-10-CM | POA: Insufficient documentation

## 2013-10-07 DIAGNOSIS — E669 Obesity, unspecified: Secondary | ICD-10-CM | POA: Insufficient documentation

## 2013-10-07 DIAGNOSIS — T466X5A Adverse effect of antihyperlipidemic and antiarteriosclerotic drugs, initial encounter: Secondary | ICD-10-CM | POA: Insufficient documentation

## 2013-10-07 NOTE — Assessment & Plan Note (Addendum)
Not sure if this is musculoskeletal or could potentially be the result of his statin. We will hold that for one month to see if he has improved symptoms. If so I would try again with 40 mg atorvastatin before finally stopping or switching. The dose statin/antilipid medication based on his followup labs  A bit worried about the swelling and fluid sensation behind his left knee. It is limiting his activity and I think this probably reasonable for him to be evaluated by orthopedic surgery. I Referred Him to [facility] for evaluation and treatment.

## 2013-10-07 NOTE — Assessment & Plan Note (Signed)
He is now successfully quit for going on 8 months. He does say that it is tempting when he is traveling and in areas where people smoking, but is maintained his abstinence. Congratulated on his efforts to

## 2013-10-07 NOTE — Assessment & Plan Note (Signed)
For now continue with aspirin plus Effient. After a year, if financial issues become concerning could switch to Plavix and potentially stop aspirin.

## 2013-10-07 NOTE — Assessment & Plan Note (Signed)
This is a difficult thing for him will [place] but I did admonished him to make sure he is paying attention to his dietary intake and really try to get the exercise he needs. This is the reason why one her see orthopedics. , Frustrated he did not go to cardiac rehabilitation, his work simply wouldn't allow it.

## 2013-10-07 NOTE — Progress Notes (Signed)
PATIENT: Louis Bruce MRN: [id number]  DOB: 1960-12-27   DOV:10/07/2013 PCP: Leonides Grills, MD  Clinic Note: Chief Complaint  Patient presents with  . 6 month visit    no chest pain , little,  sob , fatigue, lots lots of joint pain, NOTICE FLUID BEHIND KNEE radiating done the leg- painful, --c/o left leg goes numb for no reason , per wife- after bring intimate patient left nipple turns white    HPI: Louis Bruce is a 53 y.o.  male with a PMH below who presents today for six-month followup. Patient recall he is a patient who presented in December of 2014 with an inferior STEMI treated with DES stent to the RCA.  He had no heart failure symptoms and had a preserved EF by echocardiogram post MI  - roughly 50%.  He was switched from [place] for dyspnea and fatigue. He was having some dizziness and therefore his Coreg dose was reduced. He was supposed to see me back in February but did not make the appointment because of work schedules.  Interval History: He presents today he would not do cardiac complaint, his major concern is a little fatigued and dyspnea but mostly lots of discomfort. pain. It is mostly behind the left knee radiating down to the calf. States about Will then turn him on occasion. He's been having lots of arthralgias and aches and pains in his knees and hips as well as muscles associated with poor energy and fatigue. Is not elevated activity level but he would like to do. Successfully abstained from smoking. He denies any recurrence of his anginal type chest pain with rest or exertion. No real dyspnea at rest or exertion. No PND, orthopnea or to edema. The swelling is not ankle edema. No rapid or irregular heartbeats/palpitations or arrhythmias. No syncope/near-syncope [employer] via service. A melena, hematochezia, hematuria or epistaxis. He doesn't really describe claudication symptoms it is more consistent with a constant ache in his joints and muscles.   Past  Medical History  Diagnosis Date  . ST elevation myocardial infarction (STEMI) of inferior wall 02/2013    treated with stent to RCA  . CAD S/P percutaneous coronary angioplasty 02/2013    STEMI of inf. wall -- DES x 2 RCA (Xience Alpine DES 3.5 mmx 18 & 8 mm overlapping) and residual LAD disease ~50% on relook cath  . Hypertriglyceridemia   . Dyslipidemia, goal LDL below 70   . Essential hypertension   . Former heavy tobacco smoker     Quit in December 2014  . Anxiety     Prior Cardiac Evaluation and Past Surgical History: Past Surgical History  Procedure Laterality Date  . Appendectomy    . Inguinal hernia repair    . Coronary angioplasty with stent placement  02/2013    Inferior STEMI- dRCA PCI Xience Alpine DES 3.5 mm x 18 mm & 3.5 mm x 8 mm overlapping DES; residual ~50-60% mLAD  . Transthoracic echocardiogram  03/16/2013    EF roughly 50%. Otherwise relatively normal    No Known Allergies  Current Outpatient Prescriptions  Medication Sig Dispense Refill  . HYDROcodone-acetaminophen (NORCO) 10-325 MG per tablet Take 1 tablet by mouth every 4 (four) hours as needed.      Marland Kitchen aspirin EC 81 MG EC tablet Take 1 tablet (81 mg total) by mouth daily.      Marland Kitchen atorvastatin (LIPITOR) 80 MG tablet Take 1 tablet (80 mg total) by mouth daily at 6 PM.  90 tablet  3  . carvedilol (COREG) 12.5 MG tablet Take 0.5 tablets (6.25 mg total) by mouth 2 (two) times daily with a meal.  90 tablet  3  . nitroGLYCERIN (NITROSTAT) 0.4 MG SL tablet Place 1 tablet (0.4 mg total) under the tongue every 5 (five) minutes x 3 doses as needed for chest pain.  25 tablet  4  . prasugrel (EFFIENT) 10 MG TABS tablet Take 1 tablet (10 mg total) by mouth daily.  90 tablet  3   No current facility-administered medications for this visit.    History   Social History Narrative   He is married. 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 He does have occasional shots of liquor.   He is not as active as he would like to be. Most of the time as his arthritis that stops him.    ROS: A comprehensive Review of Systems - Negative except As noted in history of present illness and below  Review of Systems  Constitutional: Positive for malaise/fatigue.       + Wgt loss - intentional.  HENT: Negative.   Gastrointestinal: Positive for constipation. Negative for blood in stool and melena.  Genitourinary: Negative for hematuria.  Musculoskeletal: Positive for joint pain and myalgias.  Neurological: Negative.   Endo/Heme/Allergies: Bruises/bleeds easily.  All other systems reviewed and are negative.   Wt Readings from Last 3 Encounters:  10/05/13 258 lb 6.4 oz (117.209 kg)  04/10/13 250 lb 1.6 oz (113.445 kg)  03/13/13 263 lb 11.2 oz (119.614 kg)   PHYSICAL EXAM BP 124/92  Pulse 62  Ht 5' 9.5" (1.765 m)  Wt 258 lb 6.4 oz (117.209 kg)  BMI 37.62 kg/m2 General appearance: alert, cooperative, appears stated age, no distress and Pleasant mood and affect Neck: no adenopathy, no carotid bruit, no JVD and supple, symmetrical, trachea midline Lungs: clear to auscultation bilaterally, normal percussion bilaterally and Mild interstitial sounds are otherwise nonlabored & good air movement Heart: regular rate and rhythm, S1, S2 normal, no murmur, click, rub or gallop and normal apical impulse Abdomen: soft, non-tender; bowel sounds normal; no masses,  no organomegaly Extremities: No clubbing cyanosis or edema. There is some tenderness along the left knee. I don't feel a Baker's cyst or swelling. There is no edema distally. Pulses: 2+ and symmetric Neurologic: Grossly normal   Adult ECG Report  Rate: 62 ;  Rhythm: normal sinus rhythm; left axis deviation with inferior MI, age undetermined. Improved involving changes from prior EKG  Recent Labs: No recent labs  ASSESSMENT /  PLAN: Atherosclerotic heart disease of native coronary artery without angina pectoris No active anginal symptoms. He is on dual therapy with aspirin plus Effient as well as statin, beta blocker.  Presence of drug coated stent in right coronary artery, 02/27/13 For now continue with aspirin plus Effient. After a year, if financial issues become concerning could switch to Plavix and potentially stop aspirin.  Dyslipidemia, goal LDL below 70 Currently on high-dose atorvastatin. He probably reduce the dose.  We are going to recheck his lipid panel with LFTs and adjust his dose accordingly.  He will be taken a one-month break to see if this helps some of his myalgias and arthralgias and fatigue.   Myalgia with arthralgia Not sure if this is musculoskeletal or could potentially be the result of his  statin. We will hold that for one month to see if he has improved symptoms. If so I would try again with 40 mg atorvastatin before finally stopping or switching. The dose statin/antilipid medication based on his followup labs  A bit worried about the swelling and fluid sensation behind his left knee. It is limiting his activity and I think this probably reasonable for him to be evaluated by orthopedic surgery. I Referred Him to [facility] for evaluation and treatment.  Fatigue A gas-filled I think this is probably related to poor sleep habits. He seems to not seem fully rested during the day. We will evaluate with CBC, TSH and vitamin D.  Smoker He is now successfully quit for going on 8 months. He does say that it is tempting when he is traveling and in areas where people smoking, but is maintained his abstinence. Congratulated on his efforts to  Essential hypertension Blood pressure well controlled on 625 mg twice a day of carvedilol to continue current course.  Obesity (BMI 30-39.9) This is a difficult thing for him will [place] but I did admonished him to make sure he is paying  attention to his dietary intake and really try to get the exercise he needs. This is the reason why one her see orthopedics. , Frustrated he did not go to cardiac rehabilitation, his work simply wouldn't allow it.    Orders Placed This Encounter  Procedures  . CBC  . Lipid panel  . Comprehensive metabolic panel  . TSH  . Vitamin D 1,25 dihydroxy  . AMB referral to orthopedics  . EKG 12-Lead   Meds ordered this encounter  Medications  . HYDROcodone-acetaminophen (NORCO) 10-325 MG per tablet    Sig: Take 1 tablet by mouth every 4 (four) hours as needed.    Followup: Six-month  DAVID W. Ellyn Hack, M.D., M.S. Interventional Cardiology [facility]

## 2013-10-07 NOTE — Assessment & Plan Note (Signed)
No active anginal symptoms. He is on dual therapy with aspirin plus Effient as well as statin, beta blocker.

## 2013-10-07 NOTE — Assessment & Plan Note (Signed)
A gas-filled I think this is probably related to poor sleep habits. He seems to not seem fully rested during the day. We will evaluate with CBC, TSH and vitamin D.

## 2013-10-07 NOTE — Assessment & Plan Note (Signed)
Currently on high-dose atorvastatin. He probably reduce the dose.  We are going to recheck his lipid panel with LFTs and adjust his dose accordingly.  He will be taken a one-month break to see if this helps some of his myalgias and arthralgias and fatigue.

## 2013-10-07 NOTE — Assessment & Plan Note (Signed)
Blood pressure well controlled on 625 mg twice a day of carvedilol to continue current course.

## 2013-10-15 LAB — LIPID PANEL
CHOL/HDL RATIO: 6.3 ratio
Cholesterol: 233 mg/dL — ABNORMAL HIGH (ref 0–200)
HDL: 37 mg/dL — AB (ref >39)
LDL Cholesterol: 121 mg/dL — ABNORMAL HIGH (ref 0–99)
Triglycerides: 377 mg/dL — ABNORMAL HIGH (ref <150)
VLDL: 75 mg/dL — AB (ref 0–40)

## 2013-10-15 LAB — COMPREHENSIVE METABOLIC PANEL
ALBUMIN: 4.6 g/dL (ref 3.5–5.2)
ALK PHOS: 85 U/L (ref 39–117)
ALT: 40 U/L (ref 0–53)
AST: 25 U/L (ref 0–37)
BILIRUBIN TOTAL: 0.6 mg/dL (ref 0.2–1.2)
BUN: 25 mg/dL — ABNORMAL HIGH (ref 6–23)
CO2: 24 mEq/L (ref 19–32)
Calcium: 10 mg/dL (ref 8.4–10.5)
Chloride: 104 mEq/L (ref 96–112)
Creat: 0.89 mg/dL (ref 0.50–1.35)
GLUCOSE: 106 mg/dL — AB (ref 70–99)
POTASSIUM: 4.5 meq/L (ref 3.5–5.3)
Sodium: 138 mEq/L (ref 135–145)
Total Protein: 6.9 g/dL (ref 6.0–8.3)

## 2013-10-15 LAB — CBC
HCT: 44.1 % (ref 39.0–52.0)
HEMOGLOBIN: 15.3 g/dL (ref 13.0–17.0)
MCH: 30.4 pg (ref 26.0–34.0)
MCHC: 34.7 g/dL (ref 30.0–36.0)
MCV: 87.7 fL (ref 78.0–100.0)
Platelets: 252 10*3/uL (ref 150–400)
RBC: 5.03 MIL/uL (ref 4.22–5.81)
RDW: 14.4 % (ref 11.5–15.5)
WBC: 13.8 10*3/uL — AB (ref 4.0–10.5)

## 2013-10-15 LAB — TSH: TSH: 0.987 u[IU]/mL (ref 0.350–4.500)

## 2013-10-20 LAB — VITAMIN D 1,25 DIHYDROXY
VITAMIN D 1, 25 (OH) TOTAL: 54 pg/mL (ref 18–72)
VITAMIN D3 1, 25 (OH): 54 pg/mL
Vitamin D2 1, 25 (OH)2: <8 pg/mL

## 2013-11-02 ENCOUNTER — Telehealth: Payer: Self-pay | Admitting: *Deleted

## 2013-11-02 NOTE — Telephone Encounter (Signed)
Message copied by Raiford Simmonds on Mon Nov 02, 2013 10:08 AM ------      Message from: Leonie Man      Created: Mon Oct 26, 2013  7:30 PM       CBC & Vit D, TSH normal       CBG is up a bit - borderline            Cholesterol levels are notably worse - definitely need to be back on statin - try back on 40 mg Lipitor 1st.            Recheck Lipids in 3 months            HARDING,DAVID W, MD       ------

## 2013-11-02 NOTE — Telephone Encounter (Signed)
LEFT MESSAGE TO CALL BACK CONCERNING LAB RESULTS. 

## 2013-11-03 NOTE — Telephone Encounter (Signed)
Spoke to patient. patient states he was taking Lipitor when  Had labs, but since then he stopped the medication due to myalgias and not feeling well. Patient states he feels a whole lot better since being off Lipitor. He does not want to restart  Lipitor. Patient is aware will contact back once Dr Ellyn Hack -makes a decision.

## 2013-11-04 NOTE — Telephone Encounter (Signed)
He was on a higher dose of Lipitor. I am starting a lower dose. If he doesn't tolerated then we can switch to Crestor.. 80 mg of Lipitor is the cause symptoms that's why I'm reducing the dose.  If after the first month that mild as is still present, we will then switch him to either Pravachol or Crestor which have the lowest likelihood for causing side effects.  Leonie Man, MD

## 2014-02-18 ENCOUNTER — Telehealth: Payer: Self-pay | Admitting: Cardiology

## 2014-02-18 ENCOUNTER — Encounter (HOSPITAL_COMMUNITY): Payer: Self-pay | Admitting: Cardiology

## 2014-02-18 NOTE — Telephone Encounter (Signed)
New Message  Pt asked about blood-thinner Medication; please call back and discuss.

## 2014-02-18 NOTE — Telephone Encounter (Signed)
The plan was to convert from Effient to Plavix -- check p2Y12 Assay after a month then if OK d/c ASA.  Cecilia

## 2014-02-18 NOTE — Telephone Encounter (Signed)
Spoke with pt, He was made a follow up appt for feb 2016 to see dr harding and he was under the impression he would be able to stop his blood thinner one year after his stenting. Aware according to the last office note, he was going to be changed to plavix if effient is to expensive and maybe stop the aspirin. Patient states that is not what he understood and wants to know what dr harding wants him to do. Aware dr harding will not be back until next week and will be back in touch then. Pt agreed with this plan.

## 2014-02-19 NOTE — Telephone Encounter (Signed)
Spoke with pt, aware of dr harding's recommendations. He will stay on the effient until he is seen.

## 2014-03-05 ENCOUNTER — Other Ambulatory Visit: Payer: Self-pay | Admitting: Cardiology

## 2014-03-08 ENCOUNTER — Other Ambulatory Visit: Payer: Self-pay | Admitting: Cardiology

## 2014-03-08 ENCOUNTER — Telehealth: Payer: Self-pay | Admitting: Cardiology

## 2014-03-08 MED ORDER — CARVEDILOL 12.5 MG PO TABS: 6.2500 mg | ORAL_TABLET | Freq: Two times a day (BID) | ORAL | Status: DC

## 2014-03-08 NOTE — Telephone Encounter (Signed)
Pt need a new prescription for his Carvedilol .Please call this today to [facility] [place]. Pt has an appointment in February,

## 2014-03-08 NOTE — Telephone Encounter (Signed)
Rx(s) sent to pharmacy electronically.  

## 2014-03-08 NOTE — Telephone Encounter (Signed)
Rx refill sent to patient pharmacy   

## 2014-03-08 NOTE — Telephone Encounter (Signed)
Resolved w/ previous phone call by JC.

## 2014-03-08 NOTE — Telephone Encounter (Signed)
Louis Bruce is calling in stating that she has a prescription for Carvedilol but it has two different direction. She would like some clarity.   thanks

## 2014-04-30 ENCOUNTER — Ambulatory Visit: Payer: 59 | Admitting: Cardiology

## 2014-05-18 ENCOUNTER — Encounter: Payer: Self-pay | Admitting: Cardiology

## 2014-05-18 ENCOUNTER — Ambulatory Visit (INDEPENDENT_AMBULATORY_CARE_PROVIDER_SITE_OTHER): Payer: 59 | Admitting: Cardiology

## 2014-05-18 VITALS — BP 112/84 | HR 63 | Ht 70.0 in | Wt 265.9 lb

## 2014-05-18 DIAGNOSIS — I10 Essential (primary) hypertension: Secondary | ICD-10-CM

## 2014-05-18 MED ORDER — CLOPIDOGREL BISULFATE 75 MG PO TABS: 75.0000 mg | ORAL_TABLET | Freq: Every day | ORAL | Status: DC

## 2014-05-18 MED ORDER — ROSUVASTATIN CALCIUM 5 MG PO TABS: ORAL_TABLET | ORAL | Status: DC

## 2014-05-18 MED ORDER — NITROGLYCERIN 0.4 MG SL SUBL: 0.4000 mg | SUBLINGUAL_TABLET | SUBLINGUAL | Status: DC | PRN

## 2014-05-18 NOTE — Assessment & Plan Note (Signed)
No angina 

## 2014-05-18 NOTE — Assessment & Plan Note (Signed)
Intolerant to Lipitor, last LDL 122

## 2014-05-18 NOTE — Patient Instructions (Addendum)
Your physician has recommended you make the following change in your medication: STOP taking your aspirin and effient. Start new prescriptions for Crestor 5 mg and clopidogrel 75 mg as directed. These prescriptions has already been sent to your pharmacy.  Your physician wants you to follow-up in: 6 months or sooner if needed with Dr. Ellyn Hack. You will receive a reminder letter in the mail two months in advance. If you don't receive a letter, please call our office to schedule the follow-up appointment.

## 2014-05-18 NOTE — Progress Notes (Signed)
05/18/2014 Vanita Ingles   [birth year]  [id number]  Primary Physician Leonides Grills, MD Primary Cardiologist:Dr harding  HPI:  54 year old male, smoker, who works as a Chief Strategy Officer. He presented 02/27/13 as an inferior STEMI. Cath revealed 100% occluded RCA. He had a DES placed and did well post MI. He did have a residual 70% LAD. Restudy prior to discharge showed this to be 50% and the plan is for medical therapy. He is here to day for post hospital follow up. He has not had chest pain. He was unable to tolerate Lipitor secondary to myalgias. He is smoking intermittently. He is restoring a 1979 CJ 7 and has noticed easy bruising and bleeding and asked about stopping Effient.   Current Outpatient Prescriptions  Medication Sig Dispense Refill  . carvedilol (COREG) 12.5 MG tablet Take 6.25 mg by mouth 2 (two) times daily with a meal.    . EFFIENT 10 MG TABS tablet Take 1 tablet by mouth  daily 90 tablet 0  . nitroGLYCERIN (NITROSTAT) 0.4 MG SL tablet Place 1 tablet (0.4 mg total) under the tongue every 5 (five) minutes x 3 doses as needed for chest pain. 25 tablet 4   No current facility-administered medications for this visit.    No Known Allergies  History   Social History  . Marital Status: Married    Spouse Name: N/A  . Number of Children: N/A  . Years of Education: N/A   Occupational History  . Not on file.   Social History Main Topics  . Smoking status: Former Smoker -- 1.00 packs/day for 35 years    Quit date: 02/27/2013  . Smokeless tobacco: Never Used  . Alcohol Use: 6.0 oz/week    10 Shots of liquor per week  . Drug Use: No  . Sexual Activity: Yes   Other Topics Concern  . Not on file   Social History Narrative   He is married. 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 He was eager to leave the hospital shortly after his MI   He quit smoking in December 2014. He does have occasional shots of liquor.   He is not as active as  he would like to be. Most of the time as his arthritis that stops him.     Review of Systems: General: negative for chills, fever, night sweats or weight changes.  Cardiovascular: negative for chest pain, dyspnea on exertion, edema, orthopnea, palpitations, paroxysmal nocturnal dyspnea or shortness of breath Dermatological: negative for rash Respiratory: negative for cough or wheezing Urologic: negative for hematuria Abdominal: negative for nausea, vomiting, diarrhea, bright red blood per rectum, melena, or hematemesis Neurologic: negative for visual changes, syncope, or dizziness All other systems reviewed and are otherwise negative except as noted above.    Blood pressure 112/84, pulse 63, height 5\' 10"  (1.778 m), weight 265 lb 14.4 oz (120.611 kg).  General appearance: alert, cooperative and no distress Neck: no carotid bruit and no JVD Lungs: clear to auscultation bilaterally Heart: regular rate and rhythm Extremities: no edema  EKG NSR inferior Qs  ASSESSMENT AND PLAN:   STEMI of inferior wall 02/27/13- RCA DES X 2 No angina   Smoker Smoking on and off   Dyslipidemia, goal LDL below 70 Intolerant to Lipitor, last LDL 122   Obesity (BMI 30-39.9) .    PLAN  Discussed with Dr Ellyn Hack- stop Effient and ASA, start Plavix. I have also asked him to try Crestor  5 mg 3 x week. He can f/u with Dr Ellyn Hack in 6 months.  Edell Mesenbrink KPA-C 05/18/2014 8:48 AM

## 2014-05-18 NOTE — Assessment & Plan Note (Signed)
Smoking on and off

## 2014-06-03 ENCOUNTER — Other Ambulatory Visit: Payer: Self-pay | Admitting: Cardiology

## 2014-06-03 NOTE — Telephone Encounter (Signed)
Carvedilol refilled. Effient changed to plavix at last OV

## 2014-06-11 ENCOUNTER — Other Ambulatory Visit: Payer: Self-pay

## 2014-06-11 MED ORDER — CLOPIDOGREL BISULFATE 75 MG PO TABS: 75.0000 mg | ORAL_TABLET | Freq: Every day | ORAL | Status: DC

## 2014-06-11 NOTE — Telephone Encounter (Signed)
Rx(s) sent to pharmacy electronically.  

## 2014-09-24 ENCOUNTER — Telehealth: Payer: Self-pay | Admitting: Cardiology

## 2014-09-24 NOTE — Telephone Encounter (Signed)
Please call,can he still use the Effient? He no longer have insurance and he has 3 months left of the Effient.He is now taking Plavix.

## 2014-09-24 NOTE — Telephone Encounter (Signed)
If pt can not take the Effient,please call his generic Plavix in to [phone]. Please call this in today if needed and please try to let him know today about the Effient.

## 2014-09-24 NOTE — Telephone Encounter (Signed)
Spoke to wife of patient.  He is prescribed Plavix currently, but has previous Rx of Effient - 3 months worth. Wants to know if he can switch back to Effient until new insurance kicks in.  She is aware plavix has generic available. Pt felt BP was better while on Effient.   Will route to [place] to advise if OK and if so recommended instructions for transitioning to new med.

## 2014-09-24 NOTE — Telephone Encounter (Signed)
Pt still has 3 months of Effient at home, no current prescription coverage.  Told wife ok to use up Effient until runs out or sees Dr. Ellyn Hack in September.

## 2014-11-19 ENCOUNTER — Ambulatory Visit: Payer: 59 | Admitting: Cardiology

## 2014-12-27 ENCOUNTER — Other Ambulatory Visit: Payer: Self-pay | Admitting: Cardiology

## 2014-12-31 ENCOUNTER — Encounter: Payer: Self-pay | Admitting: Cardiology

## 2014-12-31 ENCOUNTER — Ambulatory Visit (INDEPENDENT_AMBULATORY_CARE_PROVIDER_SITE_OTHER): Payer: BLUE CROSS/BLUE SHIELD | Admitting: Cardiology

## 2014-12-31 VITALS — BP 112/80 | HR 52 | Ht 70.0 in | Wt 256.2 lb

## 2014-12-31 DIAGNOSIS — Z79899 Other long term (current) drug therapy: Secondary | ICD-10-CM

## 2014-12-31 DIAGNOSIS — Z955 Presence of coronary angioplasty implant and graft: Secondary | ICD-10-CM

## 2014-12-31 DIAGNOSIS — I2510 Atherosclerotic heart disease of native coronary artery without angina pectoris: Secondary | ICD-10-CM | POA: Diagnosis not present

## 2014-12-31 DIAGNOSIS — I10 Essential (primary) hypertension: Secondary | ICD-10-CM | POA: Diagnosis not present

## 2014-12-31 DIAGNOSIS — E785 Hyperlipidemia, unspecified: Secondary | ICD-10-CM

## 2014-12-31 DIAGNOSIS — Z720 Tobacco use: Secondary | ICD-10-CM

## 2014-12-31 DIAGNOSIS — F17200 Nicotine dependence, unspecified, uncomplicated: Secondary | ICD-10-CM

## 2014-12-31 DIAGNOSIS — R5383 Other fatigue: Secondary | ICD-10-CM

## 2014-12-31 DIAGNOSIS — E669 Obesity, unspecified: Secondary | ICD-10-CM

## 2014-12-31 DIAGNOSIS — I2119 ST elevation (STEMI) myocardial infarction involving other coronary artery of inferior wall: Secondary | ICD-10-CM

## 2014-12-31 DIAGNOSIS — Z9861 Coronary angioplasty status: Secondary | ICD-10-CM

## 2014-12-31 LAB — LIPID PANEL
CHOL/HDL RATIO: 7.5 ratio — AB (ref <=5.0)
Cholesterol: 195 mg/dL (ref 125–200)
HDL: 26 mg/dL — ABNORMAL LOW (ref >=40)
LDL CALC: 96 mg/dL (ref <130)
Triglycerides: 363 mg/dL — ABNORMAL HIGH (ref <150)
VLDL: 73 mg/dL — ABNORMAL HIGH (ref <30)

## 2014-12-31 MED ORDER — NITROGLYCERIN 0.4 MG SL SUBL: 0.4000 mg | SUBLINGUAL_TABLET | SUBLINGUAL | Status: DC | PRN

## 2014-12-31 MED ORDER — CARVEDILOL 6.25 MG PO TABS: ORAL_TABLET | ORAL | Status: DC

## 2014-12-31 MED ORDER — CLOPIDOGREL BISULFATE 75 MG PO TABS: 75.0000 mg | ORAL_TABLET | Freq: Every day | ORAL | Status: DC

## 2014-12-31 NOTE — Progress Notes (Signed)
PCP: Louis Grills, MD  Clinic Note: Chief Complaint  Patient presents with  . Follow-up    pt denied chest pain and sob  . Coronary Artery Disease  . Hyperlipidemia    HPI: Louis Bruce is a 53 y.o. male with a PMH below who presents today for six-month follow-up of CAD and hyperlipidemia.. He had an inferior STEMI in December 2014 with occluded RCA treated with DES stent. Mostly preserved EF of 50% by echo. Originally on Brilinta converted to Effient for dyspnea and fatigue. Carvedilol dose has been reduced on several occasions due to dizziness and fatigue. He has a very difficult work schedule and is very difficult for him to make appointments. He was concerned about bruising with Effient, this was even without aspirin. He was switched to Plavix, but then ran out of Plavix and is now been using Effient again.  Louis Bruce was last seen in March by Louis Ransom, PA - no active anginal symptoms.. I last saw him in July 2015.  Having issues with cholesterol control - did not tolerate Crestor  (before that - Lipitor & Simvastatin --> joint pain & aching)  Stopped all of them - now on Fish Oil.    Recent Hospitalizations: None  Studies Reviewed: None  Interval History: Louis Bruce presents today really without any cardiac symptoms. He does have some questions about Effient versus Plavix. He finally decided he was not able tolerate even Crestor. I think he has now been on Crestor, Lipitor and simvastatin or with total inability to tolerate. -- He is taking Krill oil currently. He is very active doing whatever he wants to do around the house as well as doing physical yard work without any symptoms of chest tightness or pressure with rest or exertion. Once he does note is some positional dizziness and fatigue worse at the end of the day.  He has been trying to lose weight, and as a result has not been as good about his smoking cessation. No resting or exertional chest tightness/pressure or  dyspnea. No PND, orthopnea or edema.  No palpitations, lightheadedness, dizziness, weakness or syncope/near syncope. No TIA/amaurosis fugax symptoms. No claudication.  ROS: A comprehensive was performed. Review of Systems  Constitutional: Positive for weight loss (intentional). Negative for malaise/fatigue.  HENT: Positive for nosebleeds.   Eyes: Negative for blurred vision.  Respiratory: Positive for cough (Occasional.).   Cardiovascular: Negative for claudication.  Gastrointestinal: Negative for blood in stool and melena.  Genitourinary: Negative for hematuria.  Musculoskeletal: Positive for myalgias (While on statin. Clears up within 1 week of being off statin).  Neurological: Negative for dizziness (Only occasionally positional), weakness and headaches.  Endo/Heme/Allergies: Bruises/bleeds easily (not as bad without aspirin).  Psychiatric/Behavioral: Negative.   All other systems reviewed and are negative.   Past Medical History  Diagnosis Date  . ST elevation myocardial infarction (STEMI) of inferior wall ([place]) 02/2013    treated with stent to RCA  . CAD S/P percutaneous coronary angioplasty 02/2013    STEMI of inf. wall -- DES x 2 RCA (Xience Alpine DES 3.5 mmx 18 & 8 mm overlapping) and residual LAD disease ~50% on relook cath  . Hypertriglyceridemia   . Dyslipidemia, goal LDL below 70   . Essential hypertension   . Former heavy tobacco smoker     Quit in December 2014  . Anxiety     Past Surgical History  Procedure Laterality Date  . Appendectomy    . Inguinal hernia repair    .  Coronary angioplasty with stent placement  02/2013    Inferior STEMI- dRCA PCI Xience Alpine DES 3.5 mm x 18 mm & 3.5 mm x 8 mm overlapping DES; residual ~50-60% mLAD  . Transthoracic echocardiogram  03/16/2013    EF roughly 50%. Otherwise relatively normal  . Left heart catheterization with coronary angiogram N/A 02/27/2013    Procedure: LEFT HEART CATHETERIZATION WITH CORONARY ANGIOGRAM;   Surgeon: Louis Man, MD;  Location: [facility] CATH LAB;  Service: Cardiovascular;  Laterality: N/A;  . Percutaneous coronary stent intervention (pci-s)  02/27/2013    Procedure: PERCUTANEOUS CORONARY STENT INTERVENTION (PCI-S);  Surgeon: Louis Man, MD;  Location: [facility] CATH LAB;  Service: Cardiovascular;;  . Fractional flow reserve wire N/A 03/02/2013    Procedure: FRACTIONAL FLOW RESERVE WIRE;  Surgeon: Louis Harp, MD;  Location: [facility] CATH LAB;  Service: Cardiovascular;  Laterality: N/A;  . Doppler echocardiography  12/20/2006    EF normal  . Nm myoview ltd  12/20/2006    EF 64%   Prior to Admission medications   Medication Sig Start Date End Date Taking? Authorizing Provider  carvedilol (COREG) 12.5 MG tablet TAKE 1/2 TABLET TWICE DAILY WITH MEALS 12/27/14  Yes Louis Man, MD  clopidogrel (PLAVIX) 75 MG tablet - actually taking Effient  Take 1 tablet (75 mg total) by mouth daily. 06/11/14  Yes Louis Man, MD  nitroGLYCERIN (NITROSTAT) 0.4 MG SL tablet Place 1 tablet (0.4 mg total) under the tongue every 5 (five) minutes x 3 doses as needed for chest pain. 05/18/14  Yes Louis Quan, PA-C   Allergies  Allergen Reactions  . Crestor [Rosuvastatin Calcium] Other (See Comments)    Myalgias and arthralgias - limiting.     Social History   Social History  . Marital Status: Married    Spouse Name: N/A  . Number of Children: N/A  . Years of Education: N/A   Social History Main Topics  . Smoking status: Current Every Day Smoker -- 1.00 packs/day for 35 years    Start date: 05/31/2014  . Smokeless tobacco: Never Used  . Alcohol Use: 6.0 oz/week    10 Shots of liquor per week  . Drug Use: No  . Sexual Activity: Yes   Other Topics Concern  . None   Social History Narrative   He is married. 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 He was eager to leave the hospital shortly after his MI   He quit smoking in December 2014. He does have  occasional shots of liquor.   He is not as active as he would like to be. Most of the time as his arthritis that stops him.  -- Now back to having insurance.   Family History  Problem Relation Age of Onset  . Cancer Mother   . Heart attack Mother   . Cancer Father   . Cancer Brother     Wt Readings from Last 3 Encounters:  12/31/14 256 lb 3.2 oz (116.212 kg)  05/18/14 265 lb 14.4 oz (120.611 kg)  10/05/13 258 lb 6.4 oz (117.209 kg)    PHYSICAL EXAM BP 112/80 mmHg  Pulse 52  Ht 5\' 10"  (1.778 m)  Wt 256 lb 3.2 oz (116.212 kg)  BMI 36.76 kg/m2 General appearance: alert, cooperative, appears stated age, no distress and Pleasant mood and affect HEENT: [place]/AT, EOMI, MMM, anicteric sclera Neck: no adenopathy, no carotid bruit, no JVD and supple, symmetrical, trachea midline Lungs:  clear to auscultation bilaterally, normal percussion bilaterally and Mild interstitial sounds are otherwise nonlabored & good air movement Heart: regular rate and rhythm, S1, S2 normal, no murmur, click, rub or gallop and normal apical impulse Abdomen: soft, non-tender; bowel sounds normal; no masses, no organomegaly Extremities: No clubbing cyanosis or edema. There is some tenderness along the left knee. I don't feel a Baker's cyst or swelling. There is no edema distally. Pulses: 2+ and symmetric Neurologic: Grossly normal   Adult ECG Report  Rate: 52 ;  Rhythm: sinus bradycardia and Axis is read as right superior due to low voltage in precordial leads. (255). Otherwise normal intervals and durations. Inferior infarct, age undetermined.;   Narrative Interpretation: Despite appears EKG being being read as left axis deviation, the EKG visually looks the same.   Other studies Reviewed: Additional studies/ records that were reviewed today include:  Recent Labs:  No labs from this year.  Lab Results  Component Value Date   CHOL 195 12/31/2014   HDL 26* 12/31/2014   LDLCALC 96 12/31/2014   TRIG 363*  12/31/2014   CHOLHDL 7.5* 12/31/2014    ASSESSMENT / PLAN: Problem List Items Addressed This Visit    STEMI of inferior wall 02/27/13- RCA DES X 2    No heart failure or angina symptoms. Preserved EF by echo.      Relevant Medications   nitroGLYCERIN (NITROSTAT) 0.4 MG SL tablet   carvedilol (COREG) 6.25 MG tablet   Smoker (Chronic)    Ready to quit: No Counseling given: Yes Smoking cessation instruction/counseling given:  counseled patient on the dangers of tobacco use, advised patient to stop smoking, and reviewed strategies to maximize success. ~ 93min       Obesity (BMI 30-39.9) (Chronic)    The patient understands the need to lose weight with diet and exercise. We have discussed specific strategies for this. - He has lost weight, and I think he needs to continue to do so. I need him to continue exercise which will also help his lipids. With low HDL, obesity and hypertension he has metabolic syndrome -- I reiterated for him the importance of continued loss of 1-2 pounds a month.      Fatigue    This may be related to poor sleep habits, with bradycardia I'm concerned that it is potentially also related to beta blocker. Will reduce to 3.125 mg in the morning.       Essential hypertension (Chronic)    I really question if this is a true diagnosis for him. He is barely tolerating 625 mg twice a day. Plan is to reduce his morning dose to 3.125 mg in the evening dose stable at 6.25 mg -- We are arranging his prescriptions to help him not waste his current pills - with a new 3 month supply.      Relevant Medications   nitroGLYCERIN (NITROSTAT) 0.4 MG SL tablet   carvedilol (COREG) 6.25 MG tablet   Other Relevant Orders   EKG 12-Lead   Lipid panel (Completed)   Dyslipidemia, goal LDL below 70 (Chronic)    Very poorly controlled by previous labs.  He did eat a piece of toast for breakfast, but I think based on how difficult it is for him to get to a labs, we did check fasting  lipid panel today. - HDL was 26 today and LDL was 96 which is an improvement. HDL is definitely below goal, but LDL has improved, but not to goal.  Plan is  to refer back to [facility] to reconsider Repatha.       Relevant Medications   nitroGLYCERIN (NITROSTAT) 0.4 MG SL tablet   carvedilol (COREG) 6.25 MG tablet   Other Relevant Orders   EKG 12-Lead   Lipid panel (Completed)   CAD S/P percutaneous coronary angioplasty - Primary (Chronic)    2 overlapping DES stents to the RCA. Relatively large diameter. Currently on Effient, I think is fine converting back to Plavix once his Effient is completed. For now he is fine staying with Effient as long as it is covered by his insurance. He is on a beta blocker to the extent that he can tolerate with bradycardia and fatigue.  - Likely will reduce carvedilol dosing. - Statin intolerant      Relevant Medications   nitroGLYCERIN (NITROSTAT) 0.4 MG SL tablet   carvedilol (COREG) 6.25 MG tablet   Other Relevant Orders   EKG 12-Lead   Lipid panel (Completed)    Other Visit Diagnoses    Drug therapy        Relevant Orders    Lipid panel (Completed)       Current medicines are reviewed at length with the patient today. (+/- concerns) some positional dizziness and fatigue The following changes have been made:   Medications = carvediolol  6.25 mg in the evening, 1/2 tablet of 6.25 mg ( equal 3.125 mg)in the morning  Labs- lipid   Your physician recommends that you schedule a follow-up appointment in 1 month with Erasmo Downer - lipids  If you have any issues you may call the [facility] to be seen.  Your physician wants you to follow-up in 12 months with Dr Ellyn Hack- 30 mins.   Studies Ordered:   Orders Placed This Encounter  Procedures  . Lipid panel  . EKG 12-Lead      Louis Bruce, M.D., M.S. Interventional Cardiologist   Pager # [phone]  ADDENDUM - Labs ordered today   Lab Results  Component Value Date    CHOL 195 12/31/2014   HDL 26* 12/31/2014   LDLCALC 96 12/31/2014   TRIG 363* 12/31/2014   CHOLHDL 7.5* 12/31/2014    Golden Emile, Louis Green, MD

## 2014-12-31 NOTE — Patient Instructions (Addendum)
Medications = carvediolol  6.25 mg in the evening, 1/2 tablet of 6.25 mg ( equal 3.125 mg)in the morning  Labs- lipid   Your physician recommends that you schedule a follow-up appointment in 1 month with Erasmo Downer - lipids  If you have any issues you may call the [facility] to be seen.  Your physician wants you to follow-up in 12 months with Dr Ellyn Hack- 30 mins.  You will receive a reminder letter in the mail two months in advance. If you don't receive a letter, please call our office to schedule the follow-up appointment.

## 2015-01-02 ENCOUNTER — Encounter: Payer: Self-pay | Admitting: Cardiology

## 2015-01-02 NOTE — Assessment & Plan Note (Signed)
The patient understands the need to lose weight with diet and exercise. We have discussed specific strategies for this. - He has lost weight, and I think he needs to continue to do so. I need him to continue exercise which will also help his lipids. With low HDL, obesity and hypertension he has metabolic syndrome -- I reiterated for him the importance of continued loss of 1-2 pounds a month.

## 2015-01-02 NOTE — Assessment & Plan Note (Signed)
This may be related to poor sleep habits, with bradycardia I'm concerned that it is potentially also related to beta blocker. Will reduce to 3.125 mg in the morning.

## 2015-01-02 NOTE — Assessment & Plan Note (Signed)
No heart failure or angina symptoms. Preserved EF by echo.

## 2015-01-02 NOTE — Assessment & Plan Note (Addendum)
I really question if this is a true diagnosis for him. He is barely tolerating 625 mg twice a day. Plan is to reduce his morning dose to 3.125 mg in the evening dose stable at 6.25 mg -- We are arranging his prescriptions to help him not waste his current pills - with a new 3 month supply.

## 2015-01-02 NOTE — Assessment & Plan Note (Signed)
Ready to quit: No Counseling given: Yes Smoking cessation instruction/counseling given:  counseled patient on the dangers of tobacco use, advised patient to stop smoking, and reviewed strategies to maximize success. ~ 43min

## 2015-01-02 NOTE — Assessment & Plan Note (Signed)
Very poorly controlled by previous labs.  He did eat a piece of toast for breakfast, but I think based on how difficult it is for him to get to a labs, we did check fasting lipid panel today. - HDL was 26 today and LDL was 96 which is an improvement. HDL is definitely below goal, but LDL has improved, but not to goal.  Plan is to refer back to [facility] to reconsider Repatha.

## 2015-01-02 NOTE — Assessment & Plan Note (Signed)
2 overlapping DES stents to the RCA. Relatively large diameter. Currently on Effient, I think is fine converting back to Plavix once his Effient is completed. For now he is fine staying with Effient as long as it is covered by his insurance. He is on a beta blocker to the extent that he can tolerate with bradycardia and fatigue.  - Likely will reduce carvedilol dosing. - Statin intolerant

## 2015-01-07 ENCOUNTER — Telehealth: Payer: Self-pay | Admitting: *Deleted

## 2015-01-07 DIAGNOSIS — E785 Hyperlipidemia, unspecified: Secondary | ICD-10-CM

## 2015-01-07 DIAGNOSIS — I2510 Atherosclerotic heart disease of native coronary artery without angina pectoris: Secondary | ICD-10-CM

## 2015-01-07 DIAGNOSIS — Z9861 Coronary angioplasty status: Secondary | ICD-10-CM

## 2015-01-07 MED ORDER — EZETIMIBE 10 MG PO TABS: 10.0000 mg | ORAL_TABLET | Freq: Every day | ORAL | Status: DC

## 2015-01-07 NOTE — Telephone Encounter (Signed)
-----   Message from Leonie Man, MD sent at 01/05/2015  5:04 PM EDT ----- Overall lipids look better. Triglycerides are still elevated which may been thrown off by the toast. LDL level looks like it was in 2014. Continue as were doing for now, but I will refer to [facility], to discuss potential Non-Statin Rx.  Let's try Zetia 10 mg daily while we're waiting for him to get established with Erasmo Downer. He will need to have a recheck labs in 3 months after starting Zetia.  Please forward SMO:[id number] Orson Ape, MD   Leonie Man, MD

## 2015-01-07 NOTE — Telephone Encounter (Signed)
Spoke to patient. Result given . Verbalized understanding PRESCRIPTION E-SENT TO PHARMACY #30  X 6  PATIENT WILL CALL BACK IF NEED TO CHANGE  TO 90 DAY SUPPLY ROUTED INFORMATION TO DR [facility]

## 2015-03-09 ENCOUNTER — Telehealth: Payer: Self-pay | Admitting: *Deleted

## 2015-03-09 DIAGNOSIS — I2510 Atherosclerotic heart disease of native coronary artery without angina pectoris: Secondary | ICD-10-CM

## 2015-03-09 DIAGNOSIS — Z9861 Coronary angioplasty status: Secondary | ICD-10-CM

## 2015-03-09 DIAGNOSIS — E785 Hyperlipidemia, unspecified: Secondary | ICD-10-CM

## 2015-03-09 NOTE — Telephone Encounter (Signed)
-----   Message from Raiford Simmonds, RN sent at 01/07/2015  5:50 PM EDT ----- LABS MAILED LIPID

## 2015-03-09 NOTE — Telephone Encounter (Signed)
Mail letter and labslip  

## 2015-08-15 DIAGNOSIS — F17200 Nicotine dependence, unspecified, uncomplicated: Secondary | ICD-10-CM | POA: Diagnosis not present

## 2015-08-15 DIAGNOSIS — Z79899 Other long term (current) drug therapy: Secondary | ICD-10-CM | POA: Diagnosis not present

## 2015-08-15 DIAGNOSIS — R002 Palpitations: Secondary | ICD-10-CM | POA: Diagnosis not present

## 2015-08-15 DIAGNOSIS — I252 Old myocardial infarction: Secondary | ICD-10-CM | POA: Diagnosis not present

## 2015-08-15 DIAGNOSIS — Z7902 Long term (current) use of antithrombotics/antiplatelets: Secondary | ICD-10-CM | POA: Diagnosis not present

## 2015-08-15 DIAGNOSIS — I499 Cardiac arrhythmia, unspecified: Secondary | ICD-10-CM | POA: Diagnosis not present

## 2015-08-15 DIAGNOSIS — I442 Atrioventricular block, complete: Secondary | ICD-10-CM | POA: Diagnosis not present

## 2015-08-16 ENCOUNTER — Encounter (HOSPITAL_COMMUNITY): Payer: Self-pay | Admitting: *Deleted

## 2015-08-16 ENCOUNTER — Observation Stay (HOSPITAL_COMMUNITY)
Admission: AD | Admit: 2015-08-16 | Discharge: 2015-08-16 | Disposition: A | Discharge status: Home / Self Care | Payer: BLUE CROSS/BLUE SHIELD | Source: Other Acute Inpatient Hospital | Attending: Cardiology | Admitting: Cardiology

## 2015-08-16 ENCOUNTER — Observation Stay (HOSPITAL_BASED_OUTPATIENT_CLINIC_OR_DEPARTMENT_OTHER): Payer: BLUE CROSS/BLUE SHIELD

## 2015-08-16 DIAGNOSIS — I2510 Atherosclerotic heart disease of native coronary artery without angina pectoris: Secondary | ICD-10-CM | POA: Diagnosis not present

## 2015-08-16 DIAGNOSIS — R9431 Abnormal electrocardiogram [ECG] [EKG]: Secondary | ICD-10-CM

## 2015-08-16 DIAGNOSIS — R001 Bradycardia, unspecified: Principal | ICD-10-CM | POA: Insufficient documentation

## 2015-08-16 DIAGNOSIS — Z955 Presence of coronary angioplasty implant and graft: Secondary | ICD-10-CM | POA: Insufficient documentation

## 2015-08-16 DIAGNOSIS — F17200 Nicotine dependence, unspecified, uncomplicated: Secondary | ICD-10-CM | POA: Diagnosis not present

## 2015-08-16 DIAGNOSIS — I493 Ventricular premature depolarization: Secondary | ICD-10-CM

## 2015-08-16 DIAGNOSIS — E785 Hyperlipidemia, unspecified: Secondary | ICD-10-CM | POA: Diagnosis not present

## 2015-08-16 DIAGNOSIS — Z7982 Long term (current) use of aspirin: Secondary | ICD-10-CM | POA: Insufficient documentation

## 2015-08-16 DIAGNOSIS — I252 Old myocardial infarction: Secondary | ICD-10-CM | POA: Insufficient documentation

## 2015-08-16 DIAGNOSIS — I959 Hypotension, unspecified: Secondary | ICD-10-CM | POA: Diagnosis not present

## 2015-08-16 DIAGNOSIS — I10 Essential (primary) hypertension: Secondary | ICD-10-CM | POA: Diagnosis not present

## 2015-08-16 DIAGNOSIS — Z7902 Long term (current) use of antithrombotics/antiplatelets: Secondary | ICD-10-CM | POA: Insufficient documentation

## 2015-08-16 DIAGNOSIS — I442 Atrioventricular block, complete: Secondary | ICD-10-CM | POA: Insufficient documentation

## 2015-08-16 DIAGNOSIS — Z9861 Coronary angioplasty status: Secondary | ICD-10-CM

## 2015-08-16 DIAGNOSIS — I499 Cardiac arrhythmia, unspecified: Secondary | ICD-10-CM | POA: Diagnosis not present

## 2015-08-16 HISTORY — DX: Ventricular premature depolarization: I49.3

## 2015-08-16 LAB — COMPREHENSIVE METABOLIC PANEL
ALT: 45 U/L (ref 17–63)
ANION GAP: 6 (ref 5–15)
AST: 30 U/L (ref 15–41)
Albumin: 3.4 g/dL — ABNORMAL LOW (ref 3.5–5.0)
Alkaline Phosphatase: 65 U/L (ref 38–126)
BILIRUBIN TOTAL: 0.9 mg/dL (ref 0.3–1.2)
BUN: 11 mg/dL (ref 6–20)
CHLORIDE: 109 mmol/L (ref 101–111)
CO2: 24 mmol/L (ref 22–32)
Calcium: 8.8 mg/dL — ABNORMAL LOW (ref 8.9–10.3)
Creatinine, Ser: 0.85 mg/dL (ref 0.61–1.24)
Glucose, Bld: 93 mg/dL (ref 65–99)
POTASSIUM: 3.9 mmol/L (ref 3.5–5.1)
Sodium: 139 mmol/L (ref 135–145)
TOTAL PROTEIN: 5.5 g/dL — AB (ref 6.5–8.1)

## 2015-08-16 LAB — CBC
HCT: 42.3 % (ref 39.0–52.0)
Hemoglobin: 13.7 g/dL (ref 13.0–17.0)
MCH: 29.1 pg (ref 26.0–34.0)
MCHC: 32.4 g/dL (ref 30.0–36.0)
MCV: 89.8 fL (ref 78.0–100.0)
PLATELETS: 175 10*3/uL (ref 150–400)
RBC: 4.71 MIL/uL (ref 4.22–5.81)
RDW: 14.5 % (ref 11.5–15.5)
WBC: 7.7 10*3/uL (ref 4.0–10.5)

## 2015-08-16 LAB — TROPONIN I: Troponin I: <0.03 ng/mL (ref <0.031)

## 2015-08-16 LAB — PROTIME-INR
INR: 1.11 (ref 0.00–1.49)
PROTHROMBIN TIME: 14.5 s (ref 11.6–15.2)

## 2015-08-16 LAB — MRSA PCR SCREENING: MRSA by PCR: NEGATIVE

## 2015-08-16 LAB — ECHOCARDIOGRAM COMPLETE
Height: 69 in
Weight: 3693.15 oz

## 2015-08-16 LAB — BRAIN NATRIURETIC PEPTIDE: B NATRIURETIC PEPTIDE 5: 200.8 pg/mL — AB (ref 0.0–100.0)

## 2015-08-16 LAB — TSH: TSH: 2.052 u[IU]/mL (ref 0.350–4.500)

## 2015-08-16 MED ORDER — ASPIRIN EC 81 MG PO TBEC
81.0000 mg | DELAYED_RELEASE_TABLET | Freq: Every day | ORAL | Status: DC
  Filled 2015-08-16: qty 1

## 2015-08-16 MED ORDER — SODIUM CHLORIDE 0.9% FLUSH
3.0000 mL | Freq: Two times a day (BID) | INTRAVENOUS | Status: DC
  Administered 2015-08-16: 3 mL via INTRAVENOUS

## 2015-08-16 MED ORDER — EZETIMIBE 10 MG PO TABS
10.0000 mg | ORAL_TABLET | Freq: Every day | ORAL | Status: DC
  Administered 2015-08-16: 10 mg via ORAL
  Filled 2015-08-16: qty 1

## 2015-08-16 MED ORDER — NITROGLYCERIN 0.4 MG SL SUBL: 0.4000 mg | SUBLINGUAL_TABLET | SUBLINGUAL | Status: DC | PRN

## 2015-08-16 MED ORDER — ENOXAPARIN SODIUM 40 MG/0.4ML ~~LOC~~ SOLN
40.0000 mg | Freq: Every day | SUBCUTANEOUS | Status: DC
  Filled 2015-08-16: qty 0.4

## 2015-08-16 MED ORDER — PERFLUTREN LIPID MICROSPHERE
1.0000 mL | INTRAVENOUS | Status: AC | PRN
  Administered 2015-08-16: 2 mL via INTRAVENOUS
  Filled 2015-08-16: qty 10

## 2015-08-16 MED ORDER — CLOPIDOGREL BISULFATE 75 MG PO TABS
75.0000 mg | ORAL_TABLET | Freq: Every day | ORAL | Status: DC
  Administered 2015-08-16: 75 mg via ORAL
  Filled 2015-08-16: qty 1

## 2015-08-16 NOTE — Progress Notes (Signed)
Echocardiogram 2D Echocardiogram has been performed.  Louis Bruce 08/16/2015, 11:53 AM

## 2015-08-16 NOTE — Discharge Summary (Signed)
Physician Discharge Summary       Patient ID: Louis Bruce MRN: [id number] DOB/AGE: [birth year] 55 y.o.  Admit date: 08/16/2015 Discharge date: 08/16/2015 Primary Cardiologist:Dr. Ellyn Hack   Discharge Diagnoses:  Principal Problem:   Bradycardia Active Problems:   Essential hypertension   CAD S/P percutaneous coronary angioplasty   Dyslipidemia, goal LDL below 70   PVCs (premature ventricular contractions)   Discharged Condition: good  Procedures: none  ECHO:  Study Conclusions  - Left ventricle: The cavity size was normal. Wall thickness was  normal. Systolic function was normal. The estimated ejection  fraction was in the range of 50% to 55%. Wall motion was normal;  there were no regional wall motion abnormalities. - Aortic valve: Mildly calcified annulus.  Hospital Course:   55 yo man with PMH of inferior STEMI 12/14 with DES x2 to RCA, preserved EF ~ 50% on echo who follows with Dr. Ellyn Hack last seen October 2016. He presents with bradycardia and mild hypotension. In the clinic he has been gradually coming down on carvedilol due to dizziness and fatigue. He was last on plavix and then back on effient (had breathlessness on ticagrelor). He started noticed bp in 90s this weekend and HR monitor cuff with HR in 30-40s. He hasn't been that symptomatic otherwise. He's had an intentional 60 lb weight loss.   His labs at [facility] were unremarkable but he was transferred here with statement of complete heart block and no cardiologist there this week.  He was not in [facility].  He never had symptoms.  His BP cuff noted low HR and he felt his pulse and it was low.   So he went to ER for Eval.  He has had big. PVCs.  He have held his coreg and his BP is improved and HR mostly in the 50s. occ at 41- SB.  No chest pain no SOB.  His Echo is stable..troponin neg.   He has been seen and evaluated by Dr. Marlou Porch and found stable for discharge. Will stop coreg.  On follow up visit will decide if  he needs 48 hour holter to see how many PVCs.  If > 10,000 / 24 hours may need PVC ablation.  May also need appt with lipid clinic.    Consults: None  Significant Diagnostic Studies:  BMP Latest Ref Rng 08/16/2015 10/15/2013 03/03/2013  Glucose 65 - 99 mg/dL 93 106(H) 94  BUN 6 - 20 mg/dL 11 25(H) 16  Creatinine 0.61 - 1.24 mg/dL 0.85 0.89 0.96  Sodium 135 - 145 mmol/L 139 138 138  Potassium 3.5 - 5.1 mmol/L 3.9 4.5 4.5  Chloride 101 - 111 mmol/L 109 104 103  CO2 22 - 32 mmol/L 24 24 26   Calcium 8.9 - 10.3 mg/dL 8.8(L) 10.0 9.4   CBC Latest Ref Rng 08/16/2015 10/15/2013 03/03/2013  WBC 4.0 - 10.5 K/uL 7.7 13.8(H) 10.3  Hemoglobin 13.0 - 17.0 g/dL 13.7 15.3 15.7  Hematocrit 39.0 - 52.0 % 42.3 44.1 45.8  Platelets 150 - 400 K/uL 175 252 240   Troponin <0.03 X 3  TSH 2.052 BNP 200.8     Lipid Panel     Component Value Date/Time   CHOL 195 12/31/2014 1146   TRIG 363* 12/31/2014 1146   HDL 26* 12/31/2014 1146   CHOLHDL 7.5* 12/31/2014 1146   VLDL 73* 12/31/2014 1146   LDLCALC 96 12/31/2014 1146    Discharge Exam: Blood pressure 108/70, pulse 49, temperature 97.8 F (36.6 C), temperature source Oral, resp. rate  19, height 5\' 9"  (1.753 m), weight 230 lb 13.2 oz (104.7 kg), SpO2 96 %.  Disposition: 01-Home or Self Care     Medication List    STOP taking these medications        carvedilol 6.25 MG tablet  Commonly known as:  COREG      TAKE these medications        clopidogrel 75 MG tablet  Commonly known as:  PLAVIX  Take 1 tablet (75 mg total) by mouth daily.     ezetimibe 10 MG tablet  Commonly known as:  ZETIA  Take 1 tablet (10 mg total) by mouth daily.     KRILL OIL PO  Take 1 tablet by mouth daily.     MULTIVITAMIN PO  Take 1 tablet by mouth daily.     nitroGLYCERIN 0.4 MG SL tablet  Commonly known as:  NITROSTAT  Place 1 tablet (0.4 mg total) under the tongue every 5 (five) minutes x 3 doses as needed for chest pain.       Follow-up Information     Follow up with Glenetta Hew, MD On 09/02/2015.   Specialty:  Cardiology   Why:  at 9:00AM  with Rosaria Ferries, PA for Dr. Ellyn Hack.     Contact information:   [address] [place] [address] [phone]        Discharge Instructions: Call the office if any passing out, dizziness, lightheadedness.   Continue diet,  Your triglycerides are elevated.   Your LDL- bad cholesterol is 96 would like lower.  Continue Zetia -  may need to  arrange appt with lipid clinic though wt loss is great, on your next office visit  Stop Coreg        Signed: Cecilie Kicks Nurse Practitioner-Certified [facility]: [facility] 08/16/2015, 2:36 PM  Time spent on discharge : 30 minutes.    Personally seen and examined. Agree with above.  Stopping carvedilol 3.125 mg twice a day. Sinus bradycardia noted in the 40s when sleeping. Occasional PVCs noted on telemetry. If he had ventricular bigeminy, this would cause an excessively slow effective heart rate. This is likely what his blood pressure cuff was observing. He does not need to go to the emergency room if this were to happen again. In rare situations, PVC ablation is helpful. Discussed with them. Right now, he is a asymptomatic. EF is normal. Benign. Okay for discharge.  Candee Furbish, MD

## 2015-08-16 NOTE — Progress Notes (Signed)
Pt refusing lovenox injection as well as aspirin 81mg . Pt and spouse given education about medication and importance of vte prevention. Pt states he understands the concerns but prefers to ambulate. Patient ambulating room/hallway this am. Will continue to monitor.

## 2015-08-16 NOTE — H&P (Addendum)
Louis Bruce is an 55 y.o. male.   Chief Complaint: hypotension and bradycardia Primary cardiologist: Ellyn Bruce HPI: Louis Bruce is a 55 yo Bruce with PMH of inferior STEMI 12/14 with DES x2 to RCA, preserved EF ~ 50% on echo who follows with Louis Bruce last seen October 2016. He presents with bradycardia and mild hypotension. In the clinic he has been gradually coming down on carvedilol due to dizziness and fatigue. He was last on plavix and then back on effient (had breathlessness on ticagrelor). He started noticed bp in 90s this weekend and HR monitor cuff with HR in 30-40s. He hasn't been that symptomatic otherwise. He's had an intentional 60 lb weight loss.   His labs at [facility] were unremarkable but he was transferred here with statement of complete heart block and no cardiologist there this week.   He is relatively asymptomatic here.     Past Medical History  Diagnosis Date  . ST elevation myocardial infarction (STEMI) of inferior wall ([place]) 02/2013    treated with stent to RCA  . CAD S/P percutaneous coronary angioplasty 02/2013    STEMI of inf. wall -- DES x 2 RCA (Xience Alpine DES 3.5 mmx 18 & 8 mm overlapping) and residual LAD disease ~50% on relook cath  . Hypertriglyceridemia   . Dyslipidemia, goal LDL below 70   . Essential hypertension   . Former heavy tobacco smoker     Quit in December 2014  . Anxiety     Past Surgical History  Procedure Laterality Date  . Appendectomy    . Inguinal hernia repair    . Coronary angioplasty with stent placement  02/2013    Inferior STEMI- dRCA PCI Xience Alpine DES 3.5 mm x 18 mm & 3.5 mm x 8 mm overlapping DES; residual ~50-60% mLAD  . Transthoracic echocardiogram  03/16/2013    EF roughly 50%. Otherwise relatively normal  . Left heart catheterization with coronary angiogram N/A 02/27/2013    Procedure: LEFT HEART CATHETERIZATION WITH CORONARY ANGIOGRAM;  Surgeon: Louis Man, MD;  Location: [facility] CATH LAB;  Service: Cardiovascular;   Laterality: N/A;  . Percutaneous coronary stent intervention (pci-s)  02/27/2013    Procedure: PERCUTANEOUS CORONARY STENT INTERVENTION (PCI-S);  Surgeon: Louis Man, MD;  Location: [facility] CATH LAB;  Service: Cardiovascular;;  . Fractional flow reserve wire N/A 03/02/2013    Procedure: FRACTIONAL FLOW RESERVE WIRE;  Surgeon: Louis Harp, MD;  Location: [facility] CATH LAB;  Service: Cardiovascular;  Laterality: N/A;  . Doppler echocardiography  12/20/2006    EF normal  . Nm myoview ltd  12/20/2006    EF 64%    Family History  Problem Relation Age of Onset  . Cancer Mother   . Heart attack Mother   . Cancer Father   . Cancer Brother    Social History:  reports that he has been smoking.  He started smoking about 14 months ago. He has never used smokeless tobacco. He reports that he drinks about 6.0 oz of alcohol per week. He reports that he does not use illicit drugs.  Allergies:  Allergies  Allergen Reactions  . Crestor [Rosuvastatin Calcium] Other (See Comments)    Myalgias and arthralgias - limiting.    Medications Prior to Admission  Medication Sig Dispense Refill  . carvedilol (COREG) 6.25 MG tablet Take 3.125 mg (1/2 tablet ) in the morning ,6.25 mg in the evening 180 tablet 3  . clopidogrel (PLAVIX) 75 MG tablet Take 1 tablet (75  mg total) by mouth daily. 90 tablet 3  . ezetimibe (ZETIA) 10 MG tablet Take 1 tablet (10 mg total) by mouth daily. 30 tablet 6  . nitroGLYCERIN (NITROSTAT) 0.4 MG SL tablet Place 1 tablet (0.4 mg total) under the tongue every 5 (five) minutes x 3 doses as needed for chest pain. 25 tablet 3    No results found for this or any previous visit (from the past 48 hour(s)). No results found.  Review of Systems  Constitutional: Positive for weight loss. Negative for fever, chills and diaphoresis.  HENT: Negative for ear discharge and nosebleeds.   Eyes: Negative for double vision, photophobia and discharge.  Respiratory: Positive for shortness of  breath. Negative for hemoptysis and sputum production.   Cardiovascular: Positive for palpitations. Negative for orthopnea and claudication.  Gastrointestinal: Negative for vomiting and abdominal pain.  Genitourinary: Negative for dysuria and urgency.  Musculoskeletal: Negative for back pain and neck pain.  Skin: Negative for rash.  Neurological: Positive for dizziness. Negative for tingling and tremors.  Endo/Heme/Allergies: Negative for polydipsia.  Psychiatric/Behavioral: Negative for depression, suicidal ideas and hallucinations.    Blood pressure 101/71, pulse 55, temperature 97.5 F (36.4 C), temperature source Oral, resp. rate 14, height 5\' 9"  (1.753 m), weight 104.7 kg (230 lb 13.2 oz), SpO2 97 %. Physical Exam  Nursing note and vitals reviewed. Constitutional: He is oriented to person, place, and time. He appears well-developed and well-nourished. No distress.  HENT:  Head: Normocephalic and atraumatic.  Nose: Nose normal.  Mouth/Throat: Oropharynx is clear and moist. No oropharyngeal exudate.  Eyes: EOM are normal. Pupils are equal, round, and reactive to light. No scleral icterus.  Neck: Normal range of motion. Neck supple. No JVD present. No tracheal deviation present.  Cardiovascular: Normal heart sounds and intact distal pulses.  Exam reveals no gallop.   No murmur heard. Bradycardic, irregular  Respiratory: Effort normal and breath sounds normal. No respiratory distress. He has no wheezes.  GI: Soft. Bowel sounds are normal. He exhibits no distension. There is no tenderness. There is no rebound.  Musculoskeletal: Normal range of motion. He exhibits edema.  Neurological: He is alert and oriented to person, place, and time. No cranial nerve deficit.  Skin: Skin is warm and dry. No rash noted. He is not diaphoretic. No erythema.  Psychiatric: He has a normal mood and affect. His behavior is normal. Thought content normal.   labs reviewed from [place], cr 1.0 EKG with  multiple episodes of PVCs, bigeminy  Assessment/Plan [name] is a 55 yo Bruce with PMH of inferior STEMI 12/14 with DES x2 to RCA, preserved EF ~ 50% on echo who follows with Louis Bruce last seen October 2016 here with bradycardia. He is having PVCs, bigeminy and low normal blood pressure. He is mildly symptomatic. He has been coming down on carvedilol. We are holding carvedilol now. I favor repeat echocardiogram, watch on telemetry. Options include use of metoprolol as a less blood pressure intensive agent versus no beta-blocker or anti-hypertensive. If he remains symptomatic or EF drops from PVCs then may need to consider PVC ablation. For now, telemetry, labs, echocardiogram. Would discharge home on 48 hour monitor to assess PVC burden and other rhythm burden. Problem List 1. Bradycardia 2. Bigeminy 3. Coronary artery disease 4. Dyslipidemia Plan 1. Observation on telemetry 2. Hold coreg 3. Echocardiogram today 4. 48 hour monitor at time of discharge 5. Continue plavix and aspirin 6. Review plan with EP 7. Continue zetia  Neely Cecena,  MD 08/16/2015, 5:52 AM

## 2015-08-16 NOTE — Care Management Note (Addendum)
Case Management Note  Patient Details  Name: Louis Bruce MRN: [id number] Date of Birth: 02/13/1961  Subjective/Objective:    Bradycardia, mild hypotension                Action/Plan: Discharge Planning: AVS reviewed: Chart reviewed. No NCM needs identified. Can afford his medication.   PCP- Redmond School MD   Expected Discharge Date:  08/16/2015               Expected Discharge Plan:  Home/Self Care  In-House Referral:  NA  Discharge planning Services  CM Consult  Post Acute Care Choice:  NA Choice offered to:  NA  DME Arranged:  N/A DME Agency:  NA  HH Arranged:  NA HH Agency:  NA  Status of Service:  Completed, signed off  Medicare Important Message Given:    Date Medicare IM Given:    Medicare IM give by:    Date Additional Medicare IM Given:    Additional Medicare Important Message give by:     If discussed at [place] of Stay Meetings, dates discussed:    Additional Comments:  Erenest Rasher, RN 08/16/2015, 2:45 PM

## 2015-08-16 NOTE — Discharge Instructions (Signed)
Call the office if any passing out, dizziness, lightheadedness.   Continue diet,  Your triglycerides are elevated.   Your LDL- bad cholesterol is 96 would like lower.  Continue Zetia - may need to  arrange appt with lipid clinic though wt loss is great, on your next office visit  Stop Coreg

## 2015-08-16 NOTE — Progress Notes (Signed)
Pt profile: 55 yo man with PMH of inferior STEMI 12/14 with DES x2 to RCA, preserved EF ~ 50% on echo who follows with Dr. Ellyn Hack last seen October 2016. He presented with bradycardia and mild hypotension- meds had been decreased in OV with dizziness and fatigue. He's had an intentional 60 lb weight loss.  His coreg now held- also with PVCs. (pt had no symptoms but his monitor noted lower BP and HR)     Subjective: No chest pain no SOB, no lightheadedness or dizziness  Objective: Vital signs in last 24 hours: Temp:  [97.5 F (36.4 C)-97.8 F (36.6 C)] 97.8 F (36.6 C) (06/06 1100) Pulse Rate:  [49-55] 49 (06/06 0755) Resp:  [14-19] 19 (06/06 0755) BP: (101-108)/(70-71) 108/70 mmHg (06/06 0755) SpO2:  [96 %-97 %] 96 % (06/06 0755) Weight:  [230 lb 13.2 oz (104.7 kg)] 230 lb 13.2 oz (104.7 kg) (06/06 0500) Weight change:  Last BM Date: 08/15/15 Intake/Output from previous day: -350 06/05 0701 - 06/06 0700 In: -  Out: 350 [Urine:350] Intake/Output this shift: Total I/O In: 200 [P.O.:200] Out: -   PE: General:Pleasant affect, NAD Skin:Warm and dry, brisk capillary refill HEENT:normocephalic, sclera clear, mucus membranes moist Neck:supple, no JVD, no bruits  Heart:S1S2 RRR without murmur, gallup, rub or click Lungs:clear without rales, rhonchi, or wheezes [id number], non tender, + BS, do not palpate liver spleen or masses Ext:no lower ext edema, 2+ pedal pulses, 2+ radial pulses Neuro:alert and oriented, MAE, follows commands, + facial symmetry Tele:  SB down to 41 at times.     Lab Results:  Recent Labs  08/16/15 0728  WBC 7.7  HGB 13.7  HCT 42.3  PLT 175   BMET  Recent Labs  08/16/15 0728  NA 139  K 3.9  CL 109  CO2 24  GLUCOSE 93  BUN 11  CREATININE 0.85  CALCIUM 8.8*    Recent Labs  08/16/15 0728 08/16/15 1157  TROPONINI <0.03 <0.03    Lab Results  Component Value Date   CHOL 195 12/31/2014   HDL 26* 12/31/2014   LDLCALC 96  12/31/2014   TRIG 363* 12/31/2014   CHOLHDL 7.5* 12/31/2014   Lab Results  Component Value Date   HGBA1C 5.7* 02/27/2013     Lab Results  Component Value Date   TSH 2.052 08/16/2015    Hepatic Function Panel  Recent Labs  08/16/15 0728  PROT 5.5*  ALBUMIN 3.4*  AST 30  ALT 45  ALKPHOS 65  BILITOT 0.9   No results for input(s): CHOL in the last 72 hours. No results for input(s): PROTIME in the last 72 hours.     Studies/Results: Echo:  Study Conclusions  - Left ventricle: The cavity size was normal. Wall thickness was  normal. Systolic function was normal. The estimated ejection  fraction was in the range of 50% to 55%. Wall motion was normal;  there were no regional wall motion abnormalities. - Aortic valve: Mildly calcified annulus.  Medications: I have reviewed the patient's current medications. Scheduled Meds: . aspirin EC  81 mg Oral Daily  . clopidogrel  75 mg Oral Daily  . enoxaparin (LOVENOX) injection  40 mg Subcutaneous Daily  . ezetimibe  10 mg Oral Daily  . sodium chloride flush  3 mL Intravenous Q12H   Continuous Infusions:  PRN Meds:.nitroGLYCERIN  Assessment/Plan: Principal Problem:   Bradycardia- in the 50s now but down to 41 at times.  no symptoms BP low  stable on no meds.  Coreg stopped on admit--was on 3.125 in AM and 6.25 PM.   Active Problems:   Essential hypertension no low most likely due to wt loss 60 lbs since Dec. 2016    CAD S/P percutaneous coronary angioplasty to RCA and residual 50% LAD stenosis 2014 no chest pain on ASA and plavix.  No chest pain.     Dyslipidemia, goal LDL below 70  TG still high pt intolerant to statins. On Zetia.      PVCs (premature ventricular contractions) still with bigeminy PVCs at times. Asymptomatic normal EF.       LOS: 0 days   Time spent with pt. : 15 minutes. Cecilie Kicks  Nurse Practitioner Certified Pager [phone] or after 5pm and on weekends call [phone] 08/16/2015, 1:47  PM   Personally seen and examined. Agree with above.  55 year old male admitted with frequent PVCs. This was causing an irregular heart rate signal on his home blood pressure monitor. Also PVCs and ventricular bigeminy were causing an effective heart rate that was quite slow. He was asymptomatic but nervous. He and his [facility] went to [facility] ER. Transferred here.  Echocardiogram reassuring, normal EF. No anginal symptoms, normal troponin.  Excellent job at weight loss, blood pressure under much better control.  We will stop carvedilol because during periods of normal rhythm, his heart rate can decrease into the 40s.  If PVCs become symptomatic or greater than 10-15000 a day, consider EP evaluation, potential ablative therapy. I stated that this will be quite rare for him to need. Wife was quite anxious about this.  I'm okay with discharge home. Follow up in clinic.  Candee Furbish, MD

## 2015-08-17 LAB — HEMOGLOBIN A1C
Hgb A1c MFr Bld: 5.4 % (ref 4.8–5.6)
MEAN PLASMA GLUCOSE: 108 mg/dL

## 2015-09-02 ENCOUNTER — Encounter: Payer: Self-pay | Admitting: Physician Assistant

## 2015-09-02 ENCOUNTER — Ambulatory Visit (INDEPENDENT_AMBULATORY_CARE_PROVIDER_SITE_OTHER): Payer: BLUE CROSS/BLUE SHIELD | Admitting: Physician Assistant

## 2015-09-02 VITALS — BP 92/68 | HR 64 | Ht 69.5 in | Wt 231.5 lb

## 2015-09-02 DIAGNOSIS — I2510 Atherosclerotic heart disease of native coronary artery without angina pectoris: Secondary | ICD-10-CM

## 2015-09-02 DIAGNOSIS — I10 Essential (primary) hypertension: Secondary | ICD-10-CM

## 2015-09-02 DIAGNOSIS — S91119A Laceration without foreign body of unspecified toe without damage to nail, initial encounter: Secondary | ICD-10-CM

## 2015-09-02 DIAGNOSIS — Z9861 Coronary angioplasty status: Secondary | ICD-10-CM

## 2015-09-02 DIAGNOSIS — R001 Bradycardia, unspecified: Secondary | ICD-10-CM

## 2015-09-02 MED ORDER — CEPHALEXIN 500 MG PO CAPS: 500.0000 mg | ORAL_CAPSULE | Freq: Three times a day (TID) | ORAL | Status: DC

## 2015-09-02 NOTE — Patient Instructions (Addendum)
Rosaria Ferries, PA-C recommends that you continue on your current medications as directed. Please refer to the Current Medication list given to you today.  Your physician recommends that you schedule a follow-up appointment in: 3 months with Dr Ellyn Hack  If you need a refill on your cardiac medications before your next appointment, please call your pharmacy.

## 2015-09-02 NOTE — Progress Notes (Signed)
Cardiology Office Note   Date:  09/02/2015   ID:  Louis Bruce, Louis Bruce [birth year], MRN [id number]  PCP:  Glo Herring., MD  Cardiologist:  Dr Nolon Stalls, PA-C   Chief Complaint  Patient presents with  . Hospitalization Follow-up    doing well since hosp. visit    History of Present Illness: Louis Bruce is a 55 y.o. male with a history of inf STEMI 2014 w/ DES x 2 RCA, EF 50% echo, HLD, HTN, PVCs.  D/c 06/06 after admit for bradycardia, hypotension, Coreg d/c'd and HR improved from 30s-40s >> 50s. Decide if he needs monitor as OP.  Louis Bruce presents for Post hospital follow-up.  Since discharge from the hospital he has done very well in most ways. His blood pressure has improved. He is monitoring it at all and it is generally between [id number] 20 systolic. His heart rate is in the 50s-60s. He has not had chest pain or shortness of breath. He has not had dyspnea on exertion and in fact has been wake boarding at least once since discharge from the hospital.  He had an accident while on his boat the other day where he had a severe cut to his right third toe. He did not seek medical attention. He has a great deal of pain in the toe and there is edema and erythema in it as well. He has been treating it with over-the-counter antibiotic cream and hydrogen peroxide. He has gotten pus out of it a couple of times. He has had no fevers or chills.   Past Medical History  Diagnosis Date  . ST elevation myocardial infarction (STEMI) of inferior wall ([place]) 02/2013    treated with stent to RCA  . CAD S/P percutaneous coronary angioplasty 02/2013    STEMI of inf. wall -- DES x 2 RCA (Xience Alpine DES 3.5 mmx 18 & 8 mm overlapping) and residual LAD disease ~50% on relook cath  . Hypertriglyceridemia   . Dyslipidemia, goal LDL below 70   . Essential hypertension   . Former heavy tobacco smoker     Quit in December 2014  . Anxiety   . PVCs (premature ventricular  contractions) 08/16/2015    Past Surgical History  Procedure Laterality Date  . Appendectomy    . Inguinal hernia repair    . Coronary angioplasty with stent placement  02/2013    Inferior STEMI- dRCA PCI Xience Alpine DES 3.5 mm x 18 mm & 3.5 mm x 8 mm overlapping DES; residual ~50-60% mLAD  . Transthoracic echocardiogram  03/16/2013    EF roughly 50%. Otherwise relatively normal  . Left heart catheterization with coronary angiogram N/A 02/27/2013    Procedure: LEFT HEART CATHETERIZATION WITH CORONARY ANGIOGRAM;  Surgeon: Leonie Man, MD;  Location: [facility] CATH LAB;  Service: Cardiovascular;  Laterality: N/A;  . Percutaneous coronary stent intervention (pci-s)  02/27/2013    Procedure: PERCUTANEOUS CORONARY STENT INTERVENTION (PCI-S);  Surgeon: Leonie Man, MD;  Location: [facility] CATH LAB;  Service: Cardiovascular;;  . Fractional flow reserve wire N/A 03/02/2013    Procedure: FRACTIONAL FLOW RESERVE WIRE;  Surgeon: Lorretta Harp, MD;  Location: [facility] CATH LAB;  Service: Cardiovascular;  Laterality: N/A;  . Doppler echocardiography  12/20/2006    EF normal  . Nm myoview ltd  12/20/2006    EF 64%    Current Outpatient Prescriptions  Medication Sig Dispense Refill  . clopidogrel (PLAVIX) 75 MG tablet Take 1 tablet (  75 mg total) by mouth daily. 90 tablet 3  . ezetimibe (ZETIA) 10 MG tablet Take 1 tablet (10 mg total) by mouth daily. 30 tablet 6  . KRILL OIL PO Take 1 tablet by mouth daily.    . Multiple Vitamins-Minerals (MULTIVITAMIN PO) Take 1 tablet by mouth daily.    . nitroGLYCERIN (NITROSTAT) 0.4 MG SL tablet Place 1 tablet (0.4 mg total) under the tongue every 5 (five) minutes x 3 doses as needed for chest pain. 25 tablet 3   No current facility-administered medications for this visit.    Allergies:   Crestor and Statins    Social History:  The patient  reports that he has been smoking.  He started smoking about 15 months ago. He has never used smokeless tobacco. He reports that  he drinks about 6.0 oz of alcohol per week. He reports that he does not use illicit drugs.   Family History:  The patient's family history includes Cancer in his brother, father, and mother; Heart attack in his mother.    ROS:  Please see the history of present illness. All other systems are reviewed and negative.    PHYSICAL EXAM: VS:  BP 92/68 mmHg  Pulse 64  Ht 5' 9.5" (1.765 m)  Wt 231 lb 8 oz (105.008 kg)  BMI 33.71 kg/m2  SpO2 97% , BMI Body mass index is 33.71 kg/(m^2). GEN: Well nourished, well developed, male in no acute distress HEENT: normal for age  Neck: no JVD, no carotid bruit, no masses Cardiac: RRR; soft murmur, no rubs, or gallops Respiratory:  clear to auscultation bilaterally, normal work of breathing GI: soft, nontender, nondistended, + BS MS: no deformity or atrophy; edema and erythema right third toe, laceration goes all the way across the toe and is not currently draining although his sock is a little wet in this area; distal pulses are 2+ in all 4 extremities  Skin: warm and dry, no rash Neuro:  Strength and sensation are intact Psych: euthymic mood, full affect   EKG:  EKG is not ordered today.   Recent Labs: 08/16/2015: ALT 45; B Natriuretic Peptide 200.8*; BUN 11; Creatinine, Ser 0.85; Hemoglobin 13.7; Platelets 175; Potassium 3.9; Sodium 139; TSH 2.052    Lipid Panel    Component Value Date/Time   CHOL 195 12/31/2014 1146   TRIG 363* 12/31/2014 1146   HDL 26* 12/31/2014 1146   CHOLHDL 7.5* 12/31/2014 1146   VLDL 73* 12/31/2014 1146   LDLCALC 96 12/31/2014 1146     Wt Readings from Last 3 Encounters:  09/02/15 231 lb 8 oz (105.008 kg)  08/16/15 230 lb 13.2 oz (104.7 kg)  12/31/14 256 lb 3.2 oz (116.212 kg)     Other studies Reviewed: Additional studies/ records that were reviewed today include: Hospital records, office notes and testing.  ASSESSMENT AND PLAN:  1.  Bradycardia: His heart rate has normalized off the beta blocker. His  pulse is regular in the office today. He was in sinus rhythm at discharge. He will continue to remain off the beta blocker.  2. Hypertension: His blood pressure was elevated 1 night because he was having a great deal of pain in his toe. Otherwise, it has been well controlled and is generally less than 123456 systolic.  3. CAD: He is having no ongoing ischemic symptoms. He is on Plavix and Zetia. He was taken off aspirin earlier by Dr. Ellyn Hack. He has not tolerated statins and now does not tolerate beta blockers.  4.  Toe laceration: Louis Bruce is laceration to his toe that is displaying early signs of infection. The toe itself is swollen and erythematous and the patient states she is Because out of that a couple times. There is a minor amount of erythema extending into the forefoot. We will start him on antibiotics. His renal function was normal during his hospitalization. We will add Keflex 500 mg 4 times a day for 10 days. He understands if he develops more symptoms such as increasing redness, fever or chills, he is to seek primary care health.   Current medicines are reviewed at length with the patient today.  The patient does not have concerns regarding medicines.  The following changes have been made:  Add Keflex  Labs/ tests ordered today include:  No orders of the defined types were placed in this encounter.     Disposition:   FU with Dr. Ellyn Hack  Signed, Jonica Bickhart, [address]  09/02/2015 9:32 AM    [place] Phone: [phone]; Fax: [phone]  This note was written with the assistance of speech recognition software. Please excuse any transcriptional errors.

## 2015-09-20 ENCOUNTER — Other Ambulatory Visit: Payer: Self-pay

## 2015-09-20 MED ORDER — EZETIMIBE 10 MG PO TABS: 10.0000 mg | ORAL_TABLET | Freq: Every day | ORAL | Status: DC

## 2015-09-28 ENCOUNTER — Other Ambulatory Visit: Payer: Self-pay

## 2015-10-07 ENCOUNTER — Telehealth: Payer: Self-pay | Admitting: Cardiology

## 2015-10-07 NOTE — Telephone Encounter (Signed)
Still waiting for prior authorization for his Zetia,

## 2015-10-11 NOTE — Telephone Encounter (Signed)
Fax received from [place]: zetia coverage approved from 10/06/15 - 10/09/16

## 2015-10-13 NOTE — Telephone Encounter (Signed)
PA complete and approved. [id number]

## 2015-10-17 DIAGNOSIS — R1031 Right lower quadrant pain: Secondary | ICD-10-CM | POA: Diagnosis not present

## 2015-10-17 DIAGNOSIS — Z87442 Personal history of urinary calculi: Secondary | ICD-10-CM | POA: Diagnosis not present

## 2015-10-17 DIAGNOSIS — N390 Urinary tract infection, site not specified: Secondary | ICD-10-CM | POA: Diagnosis not present

## 2015-10-17 DIAGNOSIS — I252 Old myocardial infarction: Secondary | ICD-10-CM | POA: Diagnosis not present

## 2015-10-17 DIAGNOSIS — F17200 Nicotine dependence, unspecified, uncomplicated: Secondary | ICD-10-CM | POA: Diagnosis not present

## 2015-10-17 DIAGNOSIS — N3001 Acute cystitis with hematuria: Secondary | ICD-10-CM | POA: Diagnosis not present

## 2015-10-17 DIAGNOSIS — Z7902 Long term (current) use of antithrombotics/antiplatelets: Secondary | ICD-10-CM | POA: Diagnosis not present

## 2015-10-17 DIAGNOSIS — I10 Essential (primary) hypertension: Secondary | ICD-10-CM | POA: Diagnosis not present

## 2015-10-17 DIAGNOSIS — R109 Unspecified abdominal pain: Secondary | ICD-10-CM | POA: Diagnosis not present

## 2015-10-17 DIAGNOSIS — R319 Hematuria, unspecified: Secondary | ICD-10-CM | POA: Diagnosis not present

## 2015-10-17 DIAGNOSIS — N23 Unspecified renal colic: Secondary | ICD-10-CM | POA: Diagnosis not present

## 2015-10-17 DIAGNOSIS — Z79899 Other long term (current) drug therapy: Secondary | ICD-10-CM | POA: Diagnosis not present

## 2015-10-17 DIAGNOSIS — Z955 Presence of coronary angioplasty implant and graft: Secondary | ICD-10-CM | POA: Diagnosis not present

## 2015-10-19 ENCOUNTER — Telehealth: Payer: Self-pay | Admitting: *Deleted

## 2015-10-19 NOTE — Telephone Encounter (Signed)
Called pharmacy to see if they had received prior auth. They had not. She re-submitted the request for the refill and it went through.  Called patient's wife back and gave her the information. She was very Patent attorney.

## 2015-10-19 NOTE — Telephone Encounter (Signed)
Received incoming call from patient's wife, ok per dpr. She is saying that patient's zetia needs prior auth.  It looks like prior auth was approved from 10/06/15 to 10/09/16.  She states the pharmacy has not received it.

## 2015-11-25 ENCOUNTER — Other Ambulatory Visit: Payer: Self-pay

## 2015-11-25 MED ORDER — EZETIMIBE 10 MG PO TABS: 10.0000 mg | ORAL_TABLET | Freq: Every day | ORAL | 1 refills | Status: DC

## 2015-11-29 ENCOUNTER — Other Ambulatory Visit: Payer: Self-pay | Admitting: Cardiology

## 2015-11-29 MED ORDER — EZETIMIBE 10 MG PO TABS: 10.0000 mg | ORAL_TABLET | Freq: Every day | ORAL | 1 refills | Status: DC

## 2015-11-29 MED ORDER — CLOPIDOGREL BISULFATE 75 MG PO TABS: 75.0000 mg | ORAL_TABLET | Freq: Every day | ORAL | 1 refills | Status: DC

## 2015-11-29 NOTE — Telephone Encounter (Signed)
Rx request sent to pharmacy.  

## 2015-11-29 NOTE — Telephone Encounter (Signed)
°*  STAT* If patient is at the pharmacy, call can be transferred to refill team.   1. Which medications need to be refilled? (please list name of each medication and dose if known) Plavix  75mg  and Zetia 10mg    2. Which pharmacy/location (including street and city if local pharmacy) is medication to be sent to?[facility]- Fax#[phone]  3. Do they need a 30 day or 90 day supply? [place]

## 2015-12-02 ENCOUNTER — Encounter: Payer: Self-pay | Admitting: Cardiology

## 2015-12-02 ENCOUNTER — Ambulatory Visit (INDEPENDENT_AMBULATORY_CARE_PROVIDER_SITE_OTHER): Payer: BLUE CROSS/BLUE SHIELD | Admitting: Cardiology

## 2015-12-02 VITALS — BP 123/81 | HR 69 | Ht 70.0 in | Wt 225.4 lb

## 2015-12-02 DIAGNOSIS — Z720 Tobacco use: Secondary | ICD-10-CM

## 2015-12-02 DIAGNOSIS — F17200 Nicotine dependence, unspecified, uncomplicated: Secondary | ICD-10-CM

## 2015-12-02 DIAGNOSIS — I2510 Atherosclerotic heart disease of native coronary artery without angina pectoris: Secondary | ICD-10-CM | POA: Diagnosis not present

## 2015-12-02 DIAGNOSIS — E785 Hyperlipidemia, unspecified: Secondary | ICD-10-CM | POA: Diagnosis not present

## 2015-12-02 DIAGNOSIS — E669 Obesity, unspecified: Secondary | ICD-10-CM

## 2015-12-02 DIAGNOSIS — I10 Essential (primary) hypertension: Secondary | ICD-10-CM | POA: Diagnosis not present

## 2015-12-02 DIAGNOSIS — Z9861 Coronary angioplasty status: Secondary | ICD-10-CM

## 2015-12-02 DIAGNOSIS — I2119 ST elevation (STEMI) myocardial infarction involving other coronary artery of inferior wall: Secondary | ICD-10-CM

## 2015-12-02 NOTE — Progress Notes (Signed)
PCP: Glo Herring., MD  Clinic Note: Chief Complaint  Patient presents with  . Follow-up    3 Months; Pt states no Sx or concerns.     HPI: Louis Bruce is a 55 y.o. male with a PMH below who presents today for six-month follow-up of CAD and hyperlipidemia.. He had an inferior STEMI in December 2014 with occluded RCA treated with DES stent. Mostly preserved EF of 50% by echo. Originally on Brilinta converted to Effient for dyspnea and fatigue. Carvedilol dose has been reduced on several occasions due to dizziness and fatigue. He has a very difficult work schedule and is very difficult for him to make appointments. He was concerned about bruising with Effient, this was even without aspirin. He was switched to Plavix, but then ran out of Plavix and is now been using Effient again.  Louis Bruce was last seen June 2017 by Rosaria Ferries. This was following a hospitalization D/c 08/16/15 after admit for bradycardia, hypotension, Coreg d/c'd and HR improved from 30s-40s >> 50s. Decide if he needs monitor as OP.  Went to local hospital b/c note low BP & HR .  BB stopped.  BP has ben stable since  I last saw him in October 2016.   Having issues with cholesterol control - did not tolerate Crestor  (before that - Lipitor & Simvastatin --> joint pain & aching)  Stopped all of them - now on Fish Oil.    Recent Hospitalizations: None  Studies Reviewed: None  Interval History: Japhet presents today really without any cardiac symptoms.  Feels fine off of BB- more energy.Marland Kitchen He is active and does not have any symptoms of chest tightness pressure with rest or exertion. No rapid irregular heartbeats or palpitations. No bleeding issues on Plavix.  He is very active doing whatever he wants to do around the house as well as doing physical yard work without any symptoms of chest tightness or pressure with rest or exertion. Once he does note is some positional dizziness and fatigue worse at the end of  the day.  He has been trying to lose weight, and as a result has not been as good about his smoking cessation. No resting or exertional chest tightness/pressure or dyspnea. No PND, orthopnea or edema.  No palpitations, lightheadedness, dizziness, weakness or syncope/near syncope. No TIA/amaurosis fugax symptoms. No claudication.  1/2 PPD smoking - not imaged in stopping  ROS: A comprehensive was performed. Review of Systems  Constitutional: Positive for weight loss (intentional). Negative for malaise/fatigue.  HENT: Positive for nosebleeds.   Eyes: Negative for blurred vision.  Respiratory: Positive for cough (Occasional.).   Cardiovascular: Negative for claudication.  Gastrointestinal: Negative for blood in stool and melena.  Genitourinary: Negative for hematuria.  Musculoskeletal: Positive for myalgias (While on statin. Clears up within 1 week of being off statin).  Neurological: Negative for dizziness (Only occasionally positional), weakness and headaches.  Endo/Heme/Allergies: Bruises/bleeds easily (not as bad without aspirin).  Psychiatric/Behavioral: Negative.   All other systems reviewed and are negative.   Past Medical History:  Diagnosis Date  . Anxiety   . CAD S/P percutaneous coronary angioplasty 02/2013   STEMI of inf. wall -- DES x 2 RCA (Xience Alpine DES 3.5 mmx 18 & 8 mm overlapping) and residual LAD disease ~50% on relook cath  . Dyslipidemia, goal LDL below 70   . Essential hypertension   . Former heavy tobacco smoker    Quit in December 2014  . Hypertriglyceridemia   .  PVCs (premature ventricular contractions) 08/16/2015  . ST elevation myocardial infarction (STEMI) of inferior wall ([place]) 02/2013   treated with stent to RCA    Past Surgical History:  Procedure Laterality Date  . APPENDECTOMY    . CORONARY ANGIOPLASTY WITH STENT PLACEMENT  02/2013   Inferior STEMI- dRCA PCI Xience Alpine DES 3.5 mm x 18 mm & 3.5 mm x 8 mm overlapping DES; residual ~50-60%  mLAD  . DOPPLER ECHOCARDIOGRAPHY  12/20/2006   EF normal  . FRACTIONAL FLOW RESERVE WIRE N/A 03/02/2013   Procedure: FRACTIONAL FLOW RESERVE WIRE;  Surgeon: Lorretta Harp, MD;  Location: [facility] CATH LAB;  Service: Cardiovascular;  Laterality: N/A;  . INGUINAL HERNIA REPAIR    . LEFT HEART CATHETERIZATION WITH CORONARY ANGIOGRAM N/A 02/27/2013   Procedure: LEFT HEART CATHETERIZATION WITH CORONARY ANGIOGRAM;  Surgeon: Leonie Man, MD;  Location: [facility] CATH LAB;  Service: Cardiovascular;  Laterality: N/A;  . NM MYOVIEW LTD  12/20/2006   EF 64%  . PERCUTANEOUS CORONARY STENT INTERVENTION (PCI-S)  02/27/2013   Procedure: PERCUTANEOUS CORONARY STENT INTERVENTION (PCI-S);  Surgeon: Leonie Man, MD;  Location: [facility] CATH LAB;  Service: Cardiovascular;;  . TRANSTHORACIC ECHOCARDIOGRAM  03/16/2013   EF roughly 50%. Otherwise relatively normal   Prior to Admission medications   Medication Sig Start Date End Date Taking? Authorizing Provider  carvedilol (COREG) 12.5 MG tablet TAKE 1/2 TABLET TWICE DAILY WITH MEALS 12/27/14  Yes Leonie Man, MD  clopidogrel (PLAVIX) 75 MG tablet - actually taking Effient  Take 1 tablet (75 mg total) by mouth daily. 06/11/14  Yes Leonie Man, MD  nitroGLYCERIN (NITROSTAT) 0.4 MG SL tablet Place 1 tablet (0.4 mg total) under the tongue every 5 (five) minutes x 3 doses as needed for chest pain. 05/18/14  Yes Erlene Quan, PA-C    Allergies  Allergen Reactions  . Crestor [Rosuvastatin Calcium] Other (See Comments)    Myalgias and arthralgias - limiting.  . Statins Other (See Comments)    myalgia     Social History   Social History  . Marital status: Married    Spouse name: N/A  . Number of children: N/A  . Years of education: N/A   Social History Main Topics  . Smoking status: Current Every Day Smoker    Packs/day: 1.00    Years: 35.00    Start date: 05/31/2014  . Smokeless tobacco: Never Used  . Alcohol use 6.0 oz/week    10 Shots of liquor per  week  . Drug use: No  . Sexual activity: Yes   Other Topics Concern  . None   Social History Narrative   He is married. 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 He was eager to leave the hospital shortly after his MI   He quit smoking in December 2014. He does have occasional shots of liquor.   He is not as active as he would like to be. Most of the time as his arthritis that stops him.  -- Now back to having insurance.   Family History  Problem Relation Age of Onset  . Cancer Mother   . Heart attack Mother   . Cancer Father   . Cancer Brother     Wt Readings from Last 3 Encounters:  12/02/15 102.2 kg (225 lb 6.4 oz)  09/02/15 105 kg (231 lb 8 oz)  08/16/15 104.7 kg (230 lb 13.2 oz)    PHYSICAL EXAM BP 123/81  Pulse 69   Ht 5\' 10"  (1.778 m)   Wt 102.2 kg (225 lb 6.4 oz)   BMI 32.34 kg/m  General appearance: alert, cooperative, appears stated age, no distress and Pleasant mood and affect HEENT: [place]/AT, EOMI, MMM, anicteric sclera Neck: no adenopathy, no carotid bruit, no JVD and supple, symmetrical, trachea midline Lungs: clear to auscultation bilaterally, normal percussion bilaterally and Mild interstitial sounds are otherwise nonlabored & good air movement Heart: regular rate and rhythm, S1, S2 normal, no murmur, click, rub or gallop and normal apical impulse Abdomen: soft, non-tender; bowel sounds normal; no masses, no organomegaly Extremities: No clubbing cyanosis or edema. There is some tenderness along the left knee. I don't feel a Baker's cyst or swelling. There is no edema distally. Pulses: 2+ and symmetric Neurologic: Grossly normal   Adult ECG Report n/a   Other studies Reviewed: Additional studies/ records that were reviewed today include:  Recent Labs:  No labs from this year. Apparently checked by PCP. Lab Results  Component Value Date   CHOL 195 12/31/2014   HDL 26 (L) 12/31/2014   LDLCALC 96 12/31/2014   TRIG  363 (H) 12/31/2014   CHOLHDL 7.5 (H) 12/31/2014    ASSESSMENT / PLAN: 1. Dyslipidemia, goal LDL below 70   2. CAD S/P percutaneous coronary angioplasty   3. STEMI of inferior wall 02/27/13- RCA DES X 2   4. Smoker   5. Essential hypertension   6. Obesity (BMI 30-39.9)     Problem List Items Addressed This Visit    STEMI of inferior wall 02/27/13- RCA DES X 2 (Chronic)    No recurrent anginal symptoms or heart failure symptoms. Preserved EF on echo. Extensive PCI to RCA.      Smoker (Chronic)    Ready to Quit: No Counseling Given: Yes - < 3 min (not at contemplative stage).      Relevant Orders   Lipid panel   Comprehensive metabolic panel   Obesity (BMI 30-39.9) (Chronic)    Slowly losing weight. The patient understands the need to lose weight with diet and exercise. We have discussed specific strategies for this.      Essential hypertension (Chronic)    Blood pressures actually stable off of any medication.      Relevant Orders   Lipid panel   Comprehensive metabolic panel   Dyslipidemia, goal LDL below 70 - Primary (Chronic)    Overall better as of October last year. He was supposed to be following up in our lipid clinic to discuss [place] or Praluent. I'm going to ask our lipid clinic to contact him again.  We'll check a follow-up set of lipids for a new baseline.      Relevant Orders   Lipid panel   Comprehensive metabolic panel   CAD S/P percutaneous coronary angioplasty (Chronic)    Completely asymptomatic with no recurrent angina or heart failure symptoms. 2 large diameter overlapping DES stents in the RCA. Now on Plavix alone for maintenance.  Statin intolerant. Beta blocker intolerant with recent bradycardia at admission. -- Much improved energy off a beta blocker. Blood pressure is stable and therefore would not need ACE inhibitor or ARB.      Relevant Orders   Lipid panel   Comprehensive metabolic panel    Other Visit Diagnoses   None.      Current medicines are reviewed at length with the patient today. (+/- concerns) some positional dizziness and fatigue The following changes have been made:  If you have any issues you may call the [facility] OFFICE to be seen.  Your physician wants you to follow-up in 12 months with Dr Ellyn Hack- 30 mins.   Studies Ordered:   Orders Placed This Encounter  Procedures  . Lipid panel  . Comprehensive metabolic panel      Glenetta Hew, M.D., M.S. Interventional Cardiologist   Pager # [phone]

## 2015-12-02 NOTE — Patient Instructions (Addendum)
NO CHANGES WITH CURRENT MEDICATIONS   Your physician wants you to follow-up in: 12 MONTH WITH DR HARDING. You will receive a reminder letter in the mail two months in advance. If you don't receive a letter, please call our office to schedule the follow-up appointment.   If you need a refill on your cardiac medications before your next appointment, please call your pharmacy.  

## 2015-12-04 ENCOUNTER — Encounter: Payer: Self-pay | Admitting: Cardiology

## 2015-12-04 NOTE — Assessment & Plan Note (Signed)
Completely asymptomatic with no recurrent angina or heart failure symptoms. 2 large diameter overlapping DES stents in the RCA. Now on Plavix alone for maintenance.  Statin intolerant. Beta blocker intolerant with recent bradycardia at admission. -- Much improved energy off a beta blocker. Blood pressure is stable and therefore would not need ACE inhibitor or ARB.

## 2015-12-04 NOTE — Assessment & Plan Note (Signed)
Blood pressures actually stable off of any medication.

## 2015-12-04 NOTE — Assessment & Plan Note (Signed)
Ready to Quit: No Counseling Given: Yes - < 3 min (not at contemplative stage).

## 2015-12-04 NOTE — Assessment & Plan Note (Signed)
Slowly losing weight. The patient understands the need to lose weight with diet and exercise. We have discussed specific strategies for this.

## 2015-12-04 NOTE — Assessment & Plan Note (Signed)
No recurrent anginal symptoms or heart failure symptoms. Preserved EF on echo. Extensive PCI to RCA.

## 2015-12-04 NOTE — Assessment & Plan Note (Addendum)
Overall better as of October last year. He was supposed to be following up in our lipid clinic to discuss [place] or Praluent. I'm going to ask our lipid clinic to contact him again.  We'll check a follow-up set of lipids for a new baseline.

## 2016-01-03 ENCOUNTER — Other Ambulatory Visit: Payer: Self-pay | Admitting: Cardiology

## 2016-01-11 ENCOUNTER — Other Ambulatory Visit: Payer: Self-pay | Admitting: Cardiology

## 2016-01-11 DIAGNOSIS — I10 Essential (primary) hypertension: Secondary | ICD-10-CM | POA: Diagnosis not present

## 2016-01-11 DIAGNOSIS — Z720 Tobacco use: Secondary | ICD-10-CM | POA: Diagnosis not present

## 2016-01-11 DIAGNOSIS — M67432 Ganglion, left wrist: Secondary | ICD-10-CM | POA: Diagnosis not present

## 2016-01-11 DIAGNOSIS — Z6831 Body mass index (BMI) 31.0-31.9, adult: Secondary | ICD-10-CM | POA: Diagnosis not present

## 2016-01-11 DIAGNOSIS — E782 Mixed hyperlipidemia: Secondary | ICD-10-CM | POA: Diagnosis not present

## 2016-01-11 DIAGNOSIS — I2510 Atherosclerotic heart disease of native coronary artery without angina pectoris: Secondary | ICD-10-CM | POA: Diagnosis not present

## 2016-01-11 DIAGNOSIS — Z1389 Encounter for screening for other disorder: Secondary | ICD-10-CM | POA: Diagnosis not present

## 2016-01-11 DIAGNOSIS — E785 Hyperlipidemia, unspecified: Secondary | ICD-10-CM | POA: Diagnosis not present

## 2016-01-12 LAB — COMPREHENSIVE METABOLIC PANEL
ALBUMIN: 4.5 g/dL (ref 3.5–5.5)
ALK PHOS: 84 IU/L (ref 39–117)
ALT: 38 IU/L (ref 0–44)
AST: 30 IU/L (ref 0–40)
Albumin/Globulin Ratio: 2 (ref 1.2–2.2)
BILIRUBIN TOTAL: 0.5 mg/dL (ref 0.0–1.2)
BUN / CREAT RATIO: 17 (ref 9–20)
BUN: 16 mg/dL (ref 6–24)
CHLORIDE: 102 mmol/L (ref 96–106)
CO2: 25 mmol/L (ref 18–29)
Calcium: 9.7 mg/dL (ref 8.7–10.2)
Creatinine, Ser: 0.94 mg/dL (ref 0.76–1.27)
GFR calc Af Amer: 104 mL/min/{1.73_m2} (ref >59)
GFR calc non Af Amer: 90 mL/min/{1.73_m2} (ref >59)
GLOBULIN, TOTAL: 2.2 g/dL (ref 1.5–4.5)
Glucose: 95 mg/dL (ref 65–99)
Potassium: 4.7 mmol/L (ref 3.5–5.2)
SODIUM: 140 mmol/L (ref 134–144)
Total Protein: 6.7 g/dL (ref 6.0–8.5)

## 2016-01-12 LAB — LIPID PANEL W/O CHOL/HDL RATIO
CHOLESTEROL TOTAL: 152 mg/dL (ref 100–199)
HDL: 37 mg/dL — ABNORMAL LOW (ref >39)
LDL CALC: 90 mg/dL (ref 0–99)
Triglycerides: 123 mg/dL (ref 0–149)
VLDL Cholesterol Cal: 25 mg/dL (ref 5–40)

## 2016-01-23 ENCOUNTER — Telehealth: Payer: Self-pay | Admitting: Pharmacist Clinician (PhC)/ Clinical Pharmacy Specialist

## 2016-01-23 NOTE — Telephone Encounter (Signed)
Spoke with patient, he will return call.    LDL 90, needs to be < 70.  Has intolerance to statin drugs.  Need to set appt for lipid consult.

## 2016-01-23 NOTE — Telephone Encounter (Signed)
-----   Message from Leonie Man, MD sent at 01/19/2016  6:23 PM EST ----- My note said that the plan was for him to then follow-up with [facility] lipid clinic. He was posted been doing that already. These labs should help them.  [place] ----- Message ----- From: Raiford Simmonds, RN Sent: 01/19/2016   4:08 PM To: Leonie Man, MD  Patient next appointment is in Sept 2018.wolud like to change medication prior to next visit

## 2016-01-24 DIAGNOSIS — Z1389 Encounter for screening for other disorder: Secondary | ICD-10-CM | POA: Diagnosis not present

## 2016-01-31 NOTE — Telephone Encounter (Signed)
Patient set for January 2018 appt

## 2016-03-20 ENCOUNTER — Ambulatory Visit: Payer: BLUE CROSS/BLUE SHIELD

## 2016-06-09 ENCOUNTER — Other Ambulatory Visit: Payer: Self-pay | Admitting: Cardiology

## 2016-08-14 DIAGNOSIS — Z1389 Encounter for screening for other disorder: Secondary | ICD-10-CM | POA: Diagnosis not present

## 2016-08-14 DIAGNOSIS — E669 Obesity, unspecified: Secondary | ICD-10-CM | POA: Diagnosis not present

## 2016-08-14 DIAGNOSIS — E6609 Other obesity due to excess calories: Secondary | ICD-10-CM | POA: Diagnosis not present

## 2016-08-14 DIAGNOSIS — I889 Nonspecific lymphadenitis, unspecified: Secondary | ICD-10-CM | POA: Diagnosis not present

## 2016-08-14 DIAGNOSIS — L0390 Cellulitis, unspecified: Secondary | ICD-10-CM | POA: Diagnosis not present

## 2016-08-14 DIAGNOSIS — Z6830 Body mass index (BMI) 30.0-30.9, adult: Secondary | ICD-10-CM | POA: Diagnosis not present

## 2016-08-14 DIAGNOSIS — I10 Essential (primary) hypertension: Secondary | ICD-10-CM | POA: Diagnosis not present

## 2016-08-14 DIAGNOSIS — N529 Male erectile dysfunction, unspecified: Secondary | ICD-10-CM | POA: Diagnosis not present

## 2016-08-29 ENCOUNTER — Telehealth: Payer: Self-pay

## 2016-08-29 NOTE — Telephone Encounter (Signed)
I called and spoke to Louis Bruce. She gave me all info except med list and said she will call me back tomorrow. He is only on one medication and that is his blood thinner.

## 2016-08-29 NOTE — Telephone Encounter (Signed)
Pt's wife, Jackelyn Poling, called to set up patient's colonoscopy. He had received a letter from [facility]. Patient is on blood thinners and I explained to the wife that he would need an OV prior to scheduling procedure due to his medicine. She said that wasn't going to happen and he didn't need to miss work to come to an OV to discuss his medicine when she could tell the nurse what she needed to know. I tried to explain that he would need an office visit first, but wasn't getting anywhere with the conversation. I told her that she or her husband could speak with the nurse. [phone]

## 2016-09-04 NOTE — Telephone Encounter (Signed)
Gastroenterology Pre-Procedure Review  Request Date: 08/29/2016 Requesting Physician: Dr. Gerarda Fraction  PATIENT REVIEW QUESTIONS: The patient responded to the following health history questions as indicated:    This will be first colonoscopy for pt.  HE IS REQUESTING TO BE AWAKE AND BE AWARE OF THE PROCEDURE  1. Diabetes Melitis: no 2. Joint replacements in the past 12 months: no 3. Major health problems in the past 3 months: no 4. Has an artificial valve or MVP: no 5. Has a defibrillator: no 6. Has been advised in past to take antibiotics in advance of a procedure like teeth cleaning: no 7. Family history of colon cancer: no  8. Alcohol Use: no 9. History of sleep apnea: no  10. History of coronary artery or other vascular stents placed within the last 12 months: no    MEDICATIONS & ALLERGIES:    Patient reports the following regarding taking any blood thinners:   Plavix? yes Aspirin? no Coumadin? no Brilinta? no Xarelto? no Eliquis? no Pradaxa? no Savaysa? no Effient? no  Patient confirms/reports the following medications:  Current Outpatient Prescriptions  Medication Sig Dispense Refill  . clopidogrel (PLAVIX) 75 MG tablet Take 1 tablet (75 mg total) by mouth daily. 90 tablet 1  . ezetimibe (ZETIA) 10 MG tablet Take 1 tablet (10 mg total) by mouth daily. 90 tablet 1  . KRILL OIL PO Take 1 tablet by mouth daily.    . Multiple Vitamins-Minerals (MULTIVITAMIN PO) Take 1 tablet by mouth daily.    . nitroGLYCERIN (NITROSTAT) 0.4 MG SL tablet PLACE 1 TABLET UNDER THE TONGUE EVERY 5 MINUTES FOR UP TO 3 DOSES AS NEEDED FOR CHEST PAIN 25 tablet 5   No current facility-administered medications for this visit.     Patient confirms/reports the following allergies:  Allergies  Allergen Reactions  . Crestor [Rosuvastatin Calcium] Other (See Comments)    Myalgias and arthralgias - limiting.  . Statins Other (See Comments)    myalgia    No orders of the defined types were placed in  this encounter.   AUTHORIZATION INFORMATION Primary Insurance:   ID #: group #:  Pre-Cert / Auth required: Pre-Cert / Auth #:   Secondary Insurance:  ID #:  Group #:  Pre-Cert / Auth required: Pre-Cert / Auth #:   SCHEDULE INFORMATION: Procedure has been scheduled as follows:  Date:               Time:   Location:   This Gastroenterology Pre-Precedure Review Form is being routed to the following provider(s): R. Garfield Cornea, MD

## 2016-09-04 NOTE — Telephone Encounter (Signed)
See separate triage.  

## 2016-09-04 NOTE — Telephone Encounter (Signed)
Noted. Appropriate. On Plavix.

## 2016-09-05 ENCOUNTER — Other Ambulatory Visit: Payer: Self-pay

## 2016-09-05 DIAGNOSIS — Z1211 Encounter for screening for malignant neoplasm of colon: Secondary | ICD-10-CM

## 2016-09-05 MED ORDER — NA SULFATE-K SULFATE-MG SULF 17.5-3.13-1.6 GM/177ML PO SOLN: 1.0000 | ORAL | 0 refills | Status: DC

## 2016-09-05 NOTE — Addendum Note (Signed)
Addended by: Everardo All on: 09/05/2016 02:08 PM   Modules accepted: Orders

## 2016-09-05 NOTE — Telephone Encounter (Signed)
PT has been scheduled for 10/26/2016 at 8:30 AM for his colonoscopy with Dr. Gala Romney.

## 2016-09-05 NOTE — Telephone Encounter (Signed)
Rx sent to the pharmacy and instructions mailed to pt.  

## 2016-09-17 NOTE — Telephone Encounter (Signed)
No PA is needed for TCS 

## 2016-10-02 ENCOUNTER — Telehealth: Payer: Self-pay | Admitting: Cardiology

## 2016-10-02 NOTE — Telephone Encounter (Signed)
Stop Plavix 5-7 days prior to procedure.  Wait 3-4 day post to restart.Glenetta Hew, MD

## 2016-10-02 NOTE — Telephone Encounter (Signed)
Pt needs an extraction,how many days does he need to stop his Plavix before his extraction? Please let her know asap,he needs the extraction right away.Please fax to [phone].

## 2016-10-03 NOTE — Telephone Encounter (Signed)
Faxed via EPIC

## 2016-10-15 ENCOUNTER — Telehealth: Payer: Self-pay | Admitting: Cardiology

## 2016-10-15 DIAGNOSIS — T1512XA Foreign body in conjunctival sac, left eye, initial encounter: Secondary | ICD-10-CM | POA: Diagnosis not present

## 2016-10-15 DIAGNOSIS — I10 Essential (primary) hypertension: Secondary | ICD-10-CM

## 2016-10-15 DIAGNOSIS — E785 Hyperlipidemia, unspecified: Secondary | ICD-10-CM

## 2016-10-15 NOTE — Telephone Encounter (Signed)
Per Cherene Altes RN ok to get lipid panel and CMET Will fax order to [facility] in [place] tomorrow

## 2016-10-15 NOTE — Telephone Encounter (Signed)
New Message  Pt wife call requesting to speak with RN to getting pt lab work orders sent to [facility] to be completed there by pt. Please call back to discuss

## 2016-10-16 DIAGNOSIS — R21 Rash and other nonspecific skin eruption: Secondary | ICD-10-CM | POA: Diagnosis not present

## 2016-10-16 NOTE — Telephone Encounter (Signed)
Faxed to [phone] confirmation received

## 2016-10-18 NOTE — Telephone Encounter (Signed)
I called and spoke to Louis Bruce who is removing the message in special needs stating the pt did not want sedation.

## 2016-10-18 NOTE — Telephone Encounter (Signed)
UPDATE: I contacted patient personally to discuss colonoscopy without sedation, as per triage notes, this was a request. He states that he WANTS sedation and does NOT want to be awake.  Appropriate for procedure, no need to hold Plavix, conscious sedation as per our routine. Please make sure there is nothing on the endo side that states he wants without sedation, as he would like to proceed with conscious sedation.

## 2016-10-26 ENCOUNTER — Ambulatory Visit (HOSPITAL_COMMUNITY)
Admission: RE | Admit: 2016-10-26 | Discharge: 2016-10-26 | Disposition: A | Discharge status: Home / Self Care | Payer: BLUE CROSS/BLUE SHIELD | Source: Ambulatory Visit | Attending: Internal Medicine | Admitting: Internal Medicine

## 2016-10-26 ENCOUNTER — Encounter (HOSPITAL_COMMUNITY): Payer: Self-pay | Admitting: *Deleted

## 2016-10-26 ENCOUNTER — Encounter (HOSPITAL_COMMUNITY)
Admission: RE | Disposition: A | Discharge status: Home / Self Care | Payer: Self-pay | Source: Ambulatory Visit | Attending: Internal Medicine

## 2016-10-26 DIAGNOSIS — Z1211 Encounter for screening for malignant neoplasm of colon: Secondary | ICD-10-CM | POA: Diagnosis not present

## 2016-10-26 DIAGNOSIS — I252 Old myocardial infarction: Secondary | ICD-10-CM | POA: Insufficient documentation

## 2016-10-26 DIAGNOSIS — D125 Benign neoplasm of sigmoid colon: Secondary | ICD-10-CM | POA: Insufficient documentation

## 2016-10-26 DIAGNOSIS — I10 Essential (primary) hypertension: Secondary | ICD-10-CM | POA: Insufficient documentation

## 2016-10-26 DIAGNOSIS — D123 Benign neoplasm of transverse colon: Secondary | ICD-10-CM | POA: Insufficient documentation

## 2016-10-26 DIAGNOSIS — Z79899 Other long term (current) drug therapy: Secondary | ICD-10-CM | POA: Diagnosis not present

## 2016-10-26 DIAGNOSIS — M1990 Unspecified osteoarthritis, unspecified site: Secondary | ICD-10-CM | POA: Insufficient documentation

## 2016-10-26 DIAGNOSIS — F419 Anxiety disorder, unspecified: Secondary | ICD-10-CM | POA: Diagnosis not present

## 2016-10-26 DIAGNOSIS — Z1212 Encounter for screening for malignant neoplasm of rectum: Secondary | ICD-10-CM

## 2016-10-26 DIAGNOSIS — Z7902 Long term (current) use of antithrombotics/antiplatelets: Secondary | ICD-10-CM | POA: Insufficient documentation

## 2016-10-26 DIAGNOSIS — F17210 Nicotine dependence, cigarettes, uncomplicated: Secondary | ICD-10-CM | POA: Diagnosis not present

## 2016-10-26 DIAGNOSIS — Z955 Presence of coronary angioplasty implant and graft: Secondary | ICD-10-CM | POA: Insufficient documentation

## 2016-10-26 DIAGNOSIS — E781 Pure hyperglyceridemia: Secondary | ICD-10-CM | POA: Diagnosis not present

## 2016-10-26 DIAGNOSIS — I2510 Atherosclerotic heart disease of native coronary artery without angina pectoris: Secondary | ICD-10-CM | POA: Diagnosis not present

## 2016-10-26 HISTORY — PX: COLONOSCOPY: SHX5424

## 2016-10-26 HISTORY — PX: POLYPECTOMY: SHX5525

## 2016-10-26 SURGERY — COLONOSCOPY
Anesthesia: Moderate Sedation

## 2016-10-26 MED ORDER — MIDAZOLAM HCL 5 MG/5ML IJ SOLN
INTRAMUSCULAR | Status: AC
  Filled 2016-10-26: qty 10

## 2016-10-26 MED ORDER — MEPERIDINE HCL 100 MG/ML IJ SOLN
INTRAMUSCULAR | Status: DC | PRN
  Administered 2016-10-26: 50 mg via INTRAVENOUS
  Administered 2016-10-26: 25 mg via INTRAVENOUS
  Administered 2016-10-26: 50 mg via INTRAVENOUS

## 2016-10-26 MED ORDER — MEPERIDINE HCL 100 MG/ML IJ SOLN
INTRAMUSCULAR | Status: AC
  Filled 2016-10-26: qty 2

## 2016-10-26 MED ORDER — MIDAZOLAM HCL 5 MG/5ML IJ SOLN
INTRAMUSCULAR | Status: DC | PRN
  Administered 2016-10-26: 2 mg via INTRAVENOUS
  Administered 2016-10-26: 1 mg via INTRAVENOUS
  Administered 2016-10-26: 2 mg via INTRAVENOUS

## 2016-10-26 MED ORDER — SODIUM CHLORIDE 0.9 % IV SOLN
INTRAVENOUS | Status: DC
  Administered 2016-10-26: 1000 mL via INTRAVENOUS

## 2016-10-26 MED ORDER — ONDANSETRON HCL 4 MG/2ML IJ SOLN
INTRAMUSCULAR | Status: DC | PRN
  Administered 2016-10-26: 4 mg via INTRAVENOUS

## 2016-10-26 MED ORDER — ONDANSETRON HCL 4 MG/2ML IJ SOLN
INTRAMUSCULAR | Status: AC
  Filled 2016-10-26: qty 2

## 2016-10-26 MED ORDER — STERILE WATER FOR IRRIGATION IR SOLN
Status: DC | PRN
  Administered 2016-10-26: 08:00:00

## 2016-10-26 NOTE — Discharge Instructions (Addendum)
Colon Polyps °Polyps are tissue growths inside the body. Polyps can grow in many places, including the large intestine (colon). A polyp may be a round bump or a mushroom-shaped growth. You could have one polyp or several. °Most colon polyps are noncancerous (benign). However, some colon polyps can become cancerous over time. °What are the causes? °The exact cause of colon polyps is not known. °What increases the risk? °This condition is more likely to develop in people who: °· Have a family history of colon cancer or colon polyps. °· Are older than 50 or older than 45 if they are African American. °· Have inflammatory bowel disease, such as ulcerative colitis or Crohn disease. °· Are overweight. °· Smoke cigarettes. °· Do not get enough exercise. °· Drink too much alcohol. °· Eat a diet that is: °? High in fat and red meat. °? Low in fiber. °· Had childhood cancer that was treated with abdominal radiation. ° °What are the signs or symptoms? °Most polyps do not cause symptoms. If you have symptoms, they may include: °· Blood coming from your rectum when having a bowel movement. °· Blood in your stool. The stool may look dark red or black. °· A change in bowel habits, such as constipation or diarrhea. ° °How is this diagnosed? °This condition is diagnosed with a colonoscopy. This is a procedure that uses a lighted, flexible scope to look at the inside of your colon. °How is this treated? °Treatment for this condition involves removing any polyps that are found. Those polyps will then be tested for cancer. If cancer is found, your health care provider will talk to you about options for colon cancer treatment. °Follow these instructions at home: °Diet °· Eat plenty of fiber, such as fruits, vegetables, and whole grains. °· Eat foods that are high in calcium and vitamin D, such as milk, cheese, yogurt, eggs, liver, fish, and broccoli. °· Limit foods high in fat, red meats, and processed meats, such as hot dogs, sausage,  bacon, and lunch meats. °· Maintain a healthy weight, or lose weight if recommended by your health care provider. °General instructions °· Do not smoke cigarettes. °· Do not drink alcohol excessively. °· Keep all follow-up visits as told by your health care provider. This is important. This includes keeping regularly scheduled colonoscopies. Talk to your health care provider about when you need a colonoscopy. °· Exercise every day or as told by your health care provider. °Contact a health care provider if: °· You have new or worsening bleeding during a bowel movement. °· You have new or increased blood in your stool. °· You have a change in bowel habits. °· You unexpectedly lose weight. °This information is not intended to replace advice given to you by your health care provider. Make sure you discuss any questions you have with your health care provider. °Document Released: 11/23/2003 Document Revised: 08/04/2015 Document Reviewed: 01/17/2015 °Elsevier Interactive Patient Education © 2018 [employer]vier Inc[employer] ° °Colonoscopy °Discharge Instructions ° °Read the instructions outlined below and refer to this sheet in the next few weeks. These discharge instructions provide you with general information on caring for yourself after you leave the hospital. Your doctor may also give you specific instructions. While your treatment has been planned according to the most current medical practices available, unavoidable complications occasionally occur. If you have any problems or questions after discharge, call Dr. Rourk at [phone]. °ACTIVITY °· You may resume your regular activity, but move at a slower pace for the next 24   hours.   Take frequent rest periods for the next 24 hours.   Walking will help get rid of the air and reduce the bloated feeling in your belly (abdomen).   No driving for 24 hours (because of the medicine (anesthesia) used during the test).    Do not sign any important legal documents or operate any  machinery for 24 hours (because of the anesthesia used during the test).  NUTRITION  Drink plenty of fluids.   You may resume your normal diet as instructed by your doctor.   Begin with a light meal and progress to your normal diet. Heavy or fried foods are harder to digest and may make you feel sick to your stomach (nauseated).   Avoid alcoholic beverages for 24 hours or as instructed.  MEDICATIONS  You may resume your normal medications unless your doctor tells you otherwise.  WHAT YOU CAN EXPECT TODAY  Some feelings of bloating in the abdomen.   Passage of more gas than usual.   Spotting of blood in your stool or on the toilet paper.  IF YOU HAD POLYPS REMOVED DURING THE COLONOSCOPY:  No aspirin products for 7 days or as instructed.   No alcohol for 7 days or as instructed.   Eat a soft diet for the next 24 hours.  FINDING OUT THE RESULTS OF YOUR TEST Not all test results are available during your visit. If your test results are not back during the visit, make an appointment with your caregiver to find out the results. Do not assume everything is normal if you have not heard from your caregiver or the medical facility. It is important for you to follow up on all of your test results.  SEEK IMMEDIATE MEDICAL ATTENTION IF:  You have more than a spotting of blood in your stool.   Your belly is swollen (abdominal distention).   You are nauseated or vomiting.   You have a temperature over 101.   You have abdominal pain or discomfort that is severe or gets worse throughout the day.    Colon polyp information provided  Unfortunately, your colon was not adequately cleaned out for the procedure today.  No future MRI until clips gone  Further recommendations to follow pending review of pathology report  I recommend you have another colonoscopy between now and the end of the year  Office visit with [place] in 2 months

## 2016-10-26 NOTE — H&P (Signed)
_0 @   Primary Care Physician:  Redmond School, MD Primary Gastroenterologist:  Dr. Gala Romney  Pre-Procedure History & Physical: HPI:  Louis Bruce is a 56 y.o. male is here for a screening colonoscopy.   No prior colonoscopy. No bowel symptoms. Patient on Plavix.  Past Medical History:  Diagnosis Date  . Anxiety   . CAD S/P percutaneous coronary angioplasty 02/2013   STEMI of inf. wall -- DES x 2 RCA (Xience Alpine DES 3.5 mmx 18 & 8 mm overlapping) and residual LAD disease ~50% on relook cath  . Dyslipidemia, goal LDL below 70   . Essential hypertension   . Former heavy tobacco smoker    Quit in December 2014  . Hypertriglyceridemia   . PVCs (premature ventricular contractions) 08/16/2015  . ST elevation myocardial infarction (STEMI) of inferior wall ([place]) 02/2013   treated with stent to RCA    Past Surgical History:  Procedure Laterality Date  . APPENDECTOMY    . CORONARY ANGIOPLASTY WITH STENT PLACEMENT  02/2013   Inferior STEMI- dRCA PCI Xience Alpine DES 3.5 mm x 18 mm & 3.5 mm x 8 mm overlapping DES; residual ~50-60% mLAD  . DOPPLER ECHOCARDIOGRAPHY  12/20/2006   EF normal  . FRACTIONAL FLOW RESERVE WIRE N/A 03/02/2013   Procedure: FRACTIONAL FLOW RESERVE WIRE;  Surgeon: Lorretta Harp, MD;  Location: [facility] CATH LAB;  Service: Cardiovascular;  Laterality: N/A;  . INGUINAL HERNIA REPAIR    . LEFT HEART CATHETERIZATION WITH CORONARY ANGIOGRAM N/A 02/27/2013   Procedure: LEFT HEART CATHETERIZATION WITH CORONARY ANGIOGRAM;  Surgeon: Leonie Man, MD;  Location: [facility] CATH LAB;  Service: Cardiovascular;  Laterality: N/A;  . NM MYOVIEW LTD  12/20/2006   EF 64%  . PERCUTANEOUS CORONARY STENT INTERVENTION (PCI-S)  02/27/2013   Procedure: PERCUTANEOUS CORONARY STENT INTERVENTION (PCI-S);  Surgeon: Leonie Man, MD;  Location: [facility] CATH LAB;  Service: Cardiovascular;;  . TONSILLECTOMY    . TRANSTHORACIC ECHOCARDIOGRAM  03/16/2013   EF roughly 50%. Otherwise relatively normal     Prior to Admission medications   Medication Sig Start Date End Date Taking? Authorizing Provider  clopidogrel (PLAVIX) 75 MG tablet Take 1 tablet (75 mg total) by mouth daily. 06/11/16  Yes Leonie Man, MD  ezetimibe (ZETIA) 10 MG tablet Take 1 tablet (10 mg total) by mouth daily. 06/11/16  Yes Leonie Man, MD  Multiple Vitamin (MULTIVITAMIN WITH MINERALS) TABS tablet Take 1 tablet by mouth daily.   Yes [provider]  Na Sulfate-K Sulfate-Mg Sulf (SUPREP BOWEL PREP KIT) 17.5-3.13-1.6 GM/180ML SOLN Take 1 kit by mouth as directed. 09/05/16  Yes Jazariah Teall, Cristopher Estimable, MD  ibuprofen (ADVIL,MOTRIN) 800 MG tablet Take 800 mg by mouth daily as needed for moderate pain.    [provider]  nitroGLYCERIN (NITROSTAT) 0.4 MG SL tablet PLACE 1 TABLET UNDER THE TONGUE EVERY 5 MINUTES FOR UP TO 3 DOSES AS NEEDED FOR CHEST PAIN 01/03/16   Leonie Man, MD  Tetrahydrozoline HCl (VISINE OP) Apply 1 drop to eye daily as needed (dry eyes).    [provider]    Allergies as of 09/05/2016 - Review Complete 09/04/2016  Allergen Reaction Noted  . Crestor [rosuvastatin calcium] Other (See Comments) 01/02/2015  . Statins Other (See Comments) 08/16/2015    Family History  Problem Relation Age of Onset  . Cancer Mother   . Heart attack Mother   . Cancer Father   . Cancer Brother     Social History  Social History  . Marital status: Married    Spouse name: N/A  . Number of children: N/A  . Years of education: N/A   Occupational History  . Not on file.   Social History Main Topics  . Smoking status: Current Every Day Smoker    Packs/day: 1.00    Years: 35.00    Start date: 05/31/2014  . Smokeless tobacco: Never Used  . Alcohol use No  . Drug use: No  . Sexual activity: Yes   Other Topics Concern  . Not on file   Social History Narrative   He is married. 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 He was eager to leave the  hospital shortly after his MI   He quit smoking in December 2014. He does have occasional shots of liquor.   He is not as active as he would like to be. Most of the time as his arthritis that stops him.    Review of Systems: See HPI, otherwise negative ROS  Physical Exam: BP 116/84   Pulse 62   Temp 97.9 F (36.6 C) (Oral)   Resp 16   Ht _0  (1.753 m)   Wt 198 lb (89.8 kg)   SpO2 100%   BMI 29.24 kg/m  General:   Alert,  Well-developed, well-nourished, pleasant and cooperative in NAD Lungs:  Clear throughout to auscultation.   No wheezes, crackles, or rhonchi. No acute distress. Heart:  Regular rate and rhythm; no murmurs, clicks, rubs,  or gallops. Abdomen:  Soft, nontender and nondistended. No masses, hepatosplenomegaly or hernias noted. Normal bowel sounds, without guarding, and without rebound.     Impression/Plan: Louis Bruce is now here to undergo a screening colonoscopy.   First ever average risk screening examination.  Risks, benefits, limitations, imponderables and alternatives regarding colonoscopy have been reviewed with the patient. Questions have been answered. All parties agreeable.   Discussed increased risk of bleeding in the setting of Plavix. Large polyps may be approach differently in this setting.    Notice:  This dictation was prepared with Dragon dictation along with smaller phrase technology. Any transcriptional errors that result from this process are unintentional and may not be corrected upon review.

## 2016-10-26 NOTE — Op Note (Addendum)
[facility] Patient Name: Louis Bruce Procedure Date: 10/26/2016 8:14 AM MRN: [id number] Date of Birth: 09-Jul-1960 Attending MD: Norvel Richards , MD CSN: [id number] Age: 56 Admit Type: Outpatient Procedure:                Colonoscopy Indications:              Screening for colorectal malignant neoplasm Providers:                Norvel Richards, MD, Jeanann Lewandowsky. Gwenlyn Perking RN, RN,                            Randa Spike, Technician Referring MD:              Medicines:                Midazolam 6 mg IV, Meperidine 125 mg IV,                            Ondansetron 4 mg IV Complications:            No immediate complications. Estimated Blood Loss:     Estimated blood loss: none. Procedure:                Pre-Anesthesia Assessment:                           - Prior to the procedure, a History and Physical                            was performed, and patient medications and                            allergies were reviewed. The patient's tolerance of                            previous anesthesia was also reviewed. The risks                            and benefits of the procedure and the sedation                            options and risks were discussed with the patient.                            All questions were answered, and informed consent                            was obtained. Prior Anticoagulants: The patient has                            taken no previous anticoagulant or antiplatelet                            agents and last took Plavix (clopidogrel) 1 day  prior to the procedure. ASA Grade Assessment: III -                            A patient with severe systemic disease. After                            reviewing the risks and benefits, the patient was                            deemed in satisfactory condition to undergo the                            procedure.                           After obtaining informed consent, the  colonoscope                            was passed under direct vision. Throughout the                            procedure, the patient's blood pressure, pulse, and                            oxygen saturations were monitored continuously. The                            EC-[id number] ([id number]) scope was introduced through                            the anus and advanced to the the cecum, identified                            by appendiceal orifice and ileocecal valve. The                            ileocecal valve, appendiceal orifice, and rectum                            were photographed. The quality of the bowel                            preparation was inadequate. Scope In: 8:37:13 AM Scope Out: 9:03:59 AM Total Procedure Duration: 0 hours 26 minutes 46 seconds  Findings:      The perianal and digital rectal examinations were normal.      Two semi-pedunculated polyps were found in the sigmoid colon and       transverse colon. The polyps were 6 to 8 mm in size. These polyps were       removed with a hot snare. Resection and retrieval were complete.       Estimated blood loss: none.      The exam was otherwise without abnormality on direct and retroflexion       views. (1) hemostasis clip placed on each polypectomy site. PREP GROSSLY  INADEQUATE. A FLATLESION MAY HAVE EASILY BEEN OBSCURED BY POOR PREP       TODAY. Impression:               - Preparation of the colon was inadequate.                           - Two 6 to 8 mm polyps in the sigmoid colon and in                            the transverse colon, removed with a hot snare.                            Resected and retrieved.                           - The examination was otherwise normal on direct                            and retroflexion views. Moderate Sedation:      Moderate (conscious) sedation was administered by the endoscopy nurse       and supervised by the endoscopist. The following parameters were        monitored: oxygen saturation, heart rate, blood pressure, respiratory       rate, EKG, adequacy of pulmonary ventilation, and response to care.       Total physician intraservice time was 23 minutes. Recommendation:           - Written discharge instructions were provided to                            the patient.                           - The signs and symptoms of potential delayed                            complications were discussed with the patient.                           - Return to normal activities tomorrow.                           - Resume previous diet.                           - Continue present medications.                           - Repeat colonoscopy date to be determined after                            pending pathology results are reviewed because the                            bowel preparation was suboptimal.                           -  Return to GI clinic in 2 months. No MRI until                            clips gone Procedure Code(s):        --- Professional ---                           [phone], Colonoscopy, flexible; with removal of                            tumor(s), polyp(s), or other lesion(s) by snare                            technique                           99152, Moderate sedation services provided by the                            same physician or other qualified health care                            professional performing the diagnostic or                            therapeutic service that the sedation supports,                            requiring the presence of an independent trained                            observer to assist in the monitoring of the                            patient's level of consciousness and physiological                            status; initial 15 minutes of intraservice time,                            patient age 28 years or older                           [phone], Moderate sedation services; each additional                             15 minutes intraservice time Diagnosis Code(s):        --- Professional ---                           Z12.11, Encounter for screening for malignant                            neoplasm of colon  D12.5, Benign neoplasm of sigmoid colon                           D12.3, Benign neoplasm of transverse colon (hepatic                            flexure or splenic flexure) CPT copyright 2016 American Medical Association. All rights reserved. The codes documented in this report are preliminary and upon coder review may  be revised to meet current compliance requirements. Cristopher Estimable. Triston Lisanti, MD Norvel Richards, MD 10/26/2016 9:11:36 AM This report has been signed electronically. Number of Addenda: 0

## 2016-10-29 ENCOUNTER — Encounter: Payer: Self-pay | Admitting: Internal Medicine

## 2016-11-01 ENCOUNTER — Encounter (HOSPITAL_COMMUNITY): Payer: Self-pay | Admitting: Internal Medicine

## 2016-11-15 ENCOUNTER — Telehealth: Payer: Self-pay | Admitting: Internal Medicine

## 2016-11-15 NOTE — Telephone Encounter (Signed)
Tried to call- NA-LMOM

## 2016-11-15 NOTE — Telephone Encounter (Signed)
Patient's wife, Jackelyn Poling, called to say that they received a letter about his colonoscopy results and she doesn't understand what it means and would like for the nurse to call her back at [phone]

## 2016-11-16 NOTE — Telephone Encounter (Signed)
I spoke with the pts wife and explained the letter to her and she said she understood.

## 2016-11-28 ENCOUNTER — Telehealth: Payer: Self-pay | Admitting: Cardiology

## 2016-11-28 NOTE — Telephone Encounter (Signed)
Louis Bruce (wife) she states that pt would like to stop his Zetia can he stop before having labs done to see if he can stop? Informed wife that this is not how we do that and to continue Zetia as directed until his appt and come in fasting this week to the lab so we get the results to review for appt. She will have pt come in for fasting labs before appt

## 2016-11-28 NOTE — Telephone Encounter (Signed)
Pt wants to know if he need lab work before his appt on 12-04-16 with Dr Ellyn Hack? If so,he wants to know if he can  have lab work at [facility] in [place]?Does he needs to stop his Zetia before his lab work? If so,how many days should he stop it?

## 2016-11-30 ENCOUNTER — Telehealth: Payer: Self-pay | Admitting: Cardiology

## 2016-11-30 NOTE — Telephone Encounter (Signed)
Returned call, no answer, unable to leave VM due to full inbox.  Appears orders were created for "Other" pharmacy on 10/15/16 when patient's wife called in. Orders were sent to [facility] per her request.

## 2016-11-30 NOTE — Telephone Encounter (Signed)
Wife said pt called and said [facility] [facility] said they did not have a lab order. Would you please fax to [facility] another lab order please. Fax number is [phone].Please fax this today if possible please.

## 2016-12-04 ENCOUNTER — Encounter: Payer: Self-pay | Admitting: Cardiology

## 2016-12-04 ENCOUNTER — Ambulatory Visit (INDEPENDENT_AMBULATORY_CARE_PROVIDER_SITE_OTHER): Payer: BLUE CROSS/BLUE SHIELD | Admitting: Cardiology

## 2016-12-04 VITALS — BP 106/70 | HR 58 | Ht 70.0 in | Wt 204.0 lb

## 2016-12-04 DIAGNOSIS — I2119 ST elevation (STEMI) myocardial infarction involving other coronary artery of inferior wall: Secondary | ICD-10-CM | POA: Diagnosis not present

## 2016-12-04 DIAGNOSIS — I2510 Atherosclerotic heart disease of native coronary artery without angina pectoris: Secondary | ICD-10-CM | POA: Diagnosis not present

## 2016-12-04 DIAGNOSIS — E785 Hyperlipidemia, unspecified: Secondary | ICD-10-CM

## 2016-12-04 DIAGNOSIS — Z9861 Coronary angioplasty status: Secondary | ICD-10-CM | POA: Diagnosis not present

## 2016-12-04 DIAGNOSIS — Z955 Presence of coronary angioplasty implant and graft: Secondary | ICD-10-CM

## 2016-12-04 DIAGNOSIS — R001 Bradycardia, unspecified: Secondary | ICD-10-CM

## 2016-12-04 DIAGNOSIS — F17200 Nicotine dependence, unspecified, uncomplicated: Secondary | ICD-10-CM | POA: Diagnosis not present

## 2016-12-04 DIAGNOSIS — I493 Ventricular premature depolarization: Secondary | ICD-10-CM | POA: Diagnosis not present

## 2016-12-04 DIAGNOSIS — I10 Essential (primary) hypertension: Secondary | ICD-10-CM | POA: Diagnosis not present

## 2016-12-04 NOTE — Telephone Encounter (Signed)
Patient was seen today by dr Ellyn Hack

## 2016-12-04 NOTE — Progress Notes (Signed)
PCP: Redmond School, MD  Clinic Note: Chief Complaint  Patient presents with  . Follow-up    CAD and PCI. - In setting of inferior STEMI  . Hyperlipidemia    HPI: Louis Bruce is a 56 y.o. male with a PMH below who presents today for annual f/u for CAD-PCI- Inf STEMI 02/2013..  S/p inferior STEMI in December 2014 : occluded RCA treated with DES stent.   Mostly preserved EF of 50% by echo.   Originally on Brilinta converted to Effient for dyspnea and fatigue- converted to Plavix  Carvedilol dose has been reduced on several occasions due to dizziness and fatigue.  Has had issues with statins. Did not tolerate Crestor, Lipitor or simvastatin. Was on fish oil. --> scheduled to see Tommy Medal, [place] (in CV[facility] - Cardiovascular Risk Reduction clinic)  to discuss the management, but did not make it -- now on [place] was last seen on 12/02/2015 - doing well.  Energy notably improved off of Beta Blocker. No angina, chf or palpitations. Was actively loosing weight with adjusted diet & exercise.  Recent Hospitalizations: none  Studies Personally Reviewed - (if available, images/films reviewed: From Epic Chart or Care Everywhere)  Transthoracic Echo 08/16/2015: normal LV size and thickness. Low normal EF 50-55%. No RWMA. Mild Ao Sclerosis.  Interval History: [name] returns today looking & feeling great -- significant weight loss continues (almost ~60 lb from the time of his MI).  He feels much better. His energy level is notably improved both being off of blood pressure medications and with his weight loss. He has simply just adjusted his diet and continued exercise. Unfortunately has reverted back to smoking, but fully intends to get back to quitting once he is at a stable weight. Is just too hard to do both together. He is only smoking about half-pack now though not a full pack.  He denies any resting or exertional chest tightness/pressure or dyspnea. Maybe if he rushes up  the hill he will get some dyspnea, but not with routine activity. No PND, orthopnea or edema. No palpitations, lightheadedness, dizziness, weakness or syncope/near syncope. -- He knows he has PVCs on EKG, but has not had any palpitation symptoms. No TIA/amaurosis fugax symptoms. No melena, hematochezia, hematuria, or epstaxis. - Much less bruising since stopping aspirin. Also Plavix is better than Effient. No claudication.  ROS: A comprehensive was performed. Review of Systems  Constitutional: Positive for weight loss (Intentional).  HENT: Negative for hearing loss and nosebleeds.   Eyes: Negative.   Respiratory: Positive for cough (Occasional morning cough). Negative for sputum production, shortness of breath and wheezing.   Cardiovascular:       Per history of present illness  Gastrointestinal: Negative for abdominal pain and heartburn.  Musculoskeletal: Negative for back pain, joint pain and myalgias.  Skin: Negative.   Neurological: Positive for dizziness (Sometimes positional). Negative for weakness.  Endo/Heme/Allergies: Bruises/bleeds easily.  Psychiatric/Behavioral: Negative for depression and memory loss. The patient is not nervous/anxious and does not have insomnia.   All other systems reviewed and are negative.  I have reviewed and (if needed) personally updated the patient's problem list, medications, allergies, past medical and surgical history, social and family history.   Past Medical History:  Diagnosis Date  . Anxiety   . CAD S/P percutaneous coronary angioplasty 02/2013   STEMI of inf. wall -- DES x 2 RCA (Xience Alpine DES 3.5 mmx 18 & 8 mm overlapping) and residual LAD disease ~50%  on relook cath  . Dyslipidemia, goal LDL below 70   . Essential hypertension   . Former heavy tobacco smoker    Quit in December 2014  . Hypertriglyceridemia   . PVCs (premature ventricular contractions) 08/16/2015  . ST elevation myocardial infarction (STEMI) of inferior wall ([place])  02/2013   treated with stent to RCA; Echo 08/2015 - low Normal EF 50-55%, no RWMA.    Past Surgical History:  Procedure Laterality Date  . APPENDECTOMY    . COLONOSCOPY N/A 10/26/2016   Procedure: COLONOSCOPY;  Surgeon: Daneil Dolin, MD;  [facility];  Service: Endoscopy;  Laterality: N/A;  8:30 AM  . CORONARY ANGIOPLASTY WITH STENT PLACEMENT  02/2013   Inferior STEMI- dRCA PCI Xience Alpine DES 3.5 mm x 18 mm & 3.5 mm x 8 mm overlapping DES; residual ~50-60% mLAD  . DOPPLER ECHOCARDIOGRAPHY  12/20/2006   EF normal  . FRACTIONAL FLOW RESERVE WIRE N/A 03/02/2013   Procedure: FRACTIONAL FLOW RESERVE WIRE;  Surgeon: Lorretta Harp, MD;  Location: [facility] CATH LAB;  Service: Cardiovascular;  Laterality: N/A;  . INGUINAL HERNIA REPAIR    . LEFT HEART CATHETERIZATION WITH CORONARY ANGIOGRAM N/A 02/27/2013   Procedure: LEFT HEART CATHETERIZATION WITH CORONARY ANGIOGRAM;  Surgeon: Leonie Man, MD;  Location: [facility] CATH LAB;  Service: Cardiovascular;  Laterality: N/A;  . NM MYOVIEW LTD  12/20/2006   EF 64%  . PERCUTANEOUS CORONARY STENT INTERVENTION (PCI-S)  02/27/2013   Procedure: PERCUTANEOUS CORONARY STENT INTERVENTION (PCI-S);  Surgeon: Leonie Man, MD;  Location: [facility] CATH LAB;  Service: Cardiovascular;;  . POLYPECTOMY  10/26/2016   Procedure: POLYPECTOMY;  Surgeon: Daneil Dolin, MD;  [facility]: [facility];  Service: Endoscopy;;  colon  . TONSILLECTOMY    . TRANSTHORACIC ECHOCARDIOGRAM  03/16/2013; 08/2015   a). EF roughly 50%. Otherwise relatively normal; b). normal LV size and thickness. Low normal EF 50-55%. No RWMA. Mild Ao Sclerosis.    Current Meds  Medication Sig  . clopidogrel (PLAVIX) 75 MG tablet Take 1 tablet (75 mg total) by mouth daily.  Marland Kitchen ezetimibe (ZETIA) 10 MG tablet Take 1 tablet (10 mg total) by mouth daily.  Marland Kitchen ibuprofen (ADVIL,MOTRIN) 800 MG tablet Take 800 mg by mouth daily as needed for moderate pain.  . Multiple Vitamin (MULTIVITAMIN WITH MINERALS)  TABS tablet Take 1 tablet by mouth daily.  . nitroGLYCERIN (NITROSTAT) 0.4 MG SL tablet PLACE 1 TABLET UNDER THE TONGUE EVERY 5 MINUTES FOR UP TO 3 DOSES AS NEEDED FOR CHEST PAIN    Allergies  Allergen Reactions  . Statins Other (See Comments)    myalgia    Social History   Social History  . Marital status: Married    Spouse name: N/A  . Number of children: N/A  . Years of education: N/A   Social History Main Topics  . Smoking status: Current Every Day Smoker    Packs/day: 1.00    Years: 35.00    Start date: 05/31/2014  . Smokeless tobacco: Never Used  . Alcohol use No  . Drug use: No  . Sexual activity: Yes   Other Topics Concern  . None   Social History Narrative   He is married. 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 He was eager to leave the hospital shortly after his MI   He quit smoking in December 2014. He does have occasional shots of liquor.   He is not as active  as he would like to be. Most of the time as his arthritis that stops him.    family history includes Cancer in his brother, father, and mother; Heart attack in his mother.  Wt Readings from Last 3 Encounters:  12/04/16 204 lb (92.5 kg)  10/26/16 198 lb (89.8 kg)  12/02/15 225 lb 6.4 oz (102.2 kg)  05/2013 - 265 Lb.  PHYSICAL EXAM BP 106/70   Pulse (!) 58   Ht 5\' 10"  (1.778 m)   Wt 204 lb (92.5 kg)   BMI 29.27 kg/m  Physical Exam  Constitutional: He is oriented to person, place, and time. He appears well-developed and well-nourished. No distress.  Significantly healthier appearing. He is now below the "obesity threshold."  Well-groomed.  HENT:  Head: Normocephalic and atraumatic.  Mouth/Throat: No oropharyngeal exudate.  Eyes: EOM are normal. No scleral icterus.  Neck: Normal range of motion. Neck supple. No hepatojugular reflux and no JVD present. Carotid bruit is not present. No thyromegaly present.  Cardiovascular: Regular rhythm and normal heart sounds.    Occasional extrasystoles (Known PVCs) are present. Bradycardia present.  PMI is not displaced.  Exam reveals no gallop.   No murmur heard. Pulmonary/Chest: Effort normal and breath sounds normal. He exhibits no tenderness.  Mild diffuse interstitial sounds, no wheezes rales or rhonchi  Abdominal: Soft. Bowel sounds are normal. He exhibits no distension. There is no tenderness. There is no rebound and no guarding.  Musculoskeletal: Normal range of motion. He exhibits no edema.  Neurological: He is alert and oriented to person, place, and time.  Grossly normal  Skin: Skin is warm and dry. No rash noted. No erythema.  Psychiatric: He has a normal mood and affect. His behavior is normal. Judgment and thought content normal.  Nursing note and vitals reviewed.    Adult ECG Report  Rate: 58 ;  Rhythm: sinus bradycardia and PVCs, incomplete RBBB with borderline RVH. Inferior MI, age undetermined.;   Narrative Interpretation: Essentially unchanged from last EKG with the exception of axis   Other studies Reviewed: Additional studies/ records that were reviewed today include:  Recent Labs:  No recent labs.  Lab Results  Component Value Date   CHOL 152 01/11/2016   HDL 37 (L) 01/11/2016   LDLCALC 90 01/11/2016   TRIG 123 01/11/2016   CHOLHDL 7.5 (H) 12/31/2014    ASSESSMENT / PLAN: Problem List Items Addressed This Visit    Bradycardia (Chronic)    Not able to use Beta Blocker or non-dihydropyridine calcium channel blocker.      Relevant Orders   EKG 12-Lead   Comprehensive metabolic panel   CAD S/P percutaneous coronary angioplasty - Primary (Chronic)    Overlapping DES PCI-RCA in setting of Inf STEMI. No further angina & no CHF Sx.  Preserved LVEF on Echo. On Plavix monotherapy - oK to hold for bad bruising.  Intolerant of Statins - on Zetia With baseline low BP & Bradycardia, unable to use BB or ACE-I/ARB. Energy level notably improved without Beta Blocker.  Continue with  minimal medications & significant lifestyle changes -- with wgt loss. Smoking cessation counseling.      Dyslipidemia, goal LDL below 70 (Chronic)    check since November last year. He is on Zetia and tolerating relatively well. Would need to recheck now. He was trying to figure out how to get off of Zetia. Saw did not discuss with him the potential of Repatha upright this time. Hopefully with all this weight loss, he will  continue to improve and may not require additional therapy. The client has been intolerant of at least 3 different types of statins in the past.      Relevant Orders   Lipid panel   Comprehensive metabolic panel   Essential hypertension (Chronic)    NOT A TRUE DIAGNOSIS - Borderline Hypotensive on NO MEDICATIONS.      Presence of drug coated stent in right coronary artery, 02/27/13 (Chronic)   Relevant Orders   EKG 12-Lead   PVCs (premature ventricular contractions) (Chronic)    ASYMPTOMATIC - provided copy of EKG.  RBBB pattern. With normal Echo, would not treat in absence of Sx. Especially with Beta Blocker Intolerance & Hypotension / bradycardia.      Smoker (Chronic)    Ready to quit: Not yet. Counseling given: Yes, less than 3 minutes. Once to wait until his weight stabilizes, and then will work on a plan for smoking cessation.      STEMI of inferior wall 02/27/13- RCA DES X 2 (Chronic)   Relevant Orders   EKG 12-Lead   Comprehensive metabolic panel      Current medicines are reviewed at length with the patient today. (+/- concerns) n/a The following changes have been made: continue Zetia & Plavix  Patient Instructions  Medication  if you have any excess bruising or bleeding , may hold Plavix for 2-3 days then restart.   Labs  Lipid  CMP-- NOTHING TO EAT OR DRINK THE MORNING OF THE TEST.   Your physician wants you to follow-up in [place]. You will receive a reminder letter in the mail two months in advance. If you don't receive  a letter, please call our office to schedule the follow-up appointment.  If you need a refill on your cardiac medications before your next appointment, please call your pharmacy.     Studies Ordered:   Orders Placed This Encounter  Procedures  . Lipid panel  . Comprehensive metabolic panel  . EKG 12-Lead      Glenetta Hew, M.D., M.S. Interventional Cardiologist   Pager # [phone] Phone # [phone] [address]. [employer] [place], [place] [address]

## 2016-12-04 NOTE — Assessment & Plan Note (Signed)
Not able to use Beta Blocker or non-dihydropyridine calcium channel blocker.

## 2016-12-04 NOTE — Assessment & Plan Note (Signed)
Overlapping DES PCI-RCA in setting of Inf STEMI. No further angina & no CHF Sx.  Preserved LVEF on Echo. On Plavix monotherapy - oK to hold for bad bruising.  Intolerant of Statins - on Zetia With baseline low BP & Bradycardia, unable to use BB or ACE-I/ARB. Energy level notably improved without Beta Blocker.  Continue with minimal medications & significant lifestyle changes -- with wgt loss. Smoking cessation counseling.

## 2016-12-04 NOTE — Assessment & Plan Note (Signed)
NOT A TRUE DIAGNOSIS - Borderline Hypotensive on NO MEDICATIONS.

## 2016-12-04 NOTE — Assessment & Plan Note (Addendum)
Ready to quit: Not yet. Counseling given: Yes, less than 3 minutes. Once to wait until his weight stabilizes, and then will work on a plan for smoking cessation.

## 2016-12-04 NOTE — Assessment & Plan Note (Signed)
ASYMPTOMATIC - provided copy of EKG.  RBBB pattern. With normal Echo, would not treat in absence of Sx. Especially with Beta Blocker Intolerance & Hypotension / bradycardia.

## 2016-12-04 NOTE — Patient Instructions (Addendum)
Medication  if you have any excess bruising or bleeding , may hold Plavix for 2-3 days then restart.   Labs  Lipid  CMP-- NOTHING TO EAT OR DRINK THE MORNING OF THE TEST.   Your physician wants you to follow-up in [place]. You will receive a reminder letter in the mail two months in advance. If you don't receive a letter, please call our office to schedule the follow-up appointment.  If you need a refill on your cardiac medications before your next appointment, please call your pharmacy.

## 2016-12-04 NOTE — Assessment & Plan Note (Signed)
check since November last year. He is on Zetia and tolerating relatively well. Would need to recheck now. He was trying to figure out how to get off of Zetia. Saw did not discuss with him the potential of Repatha upright this time. Hopefully with all this weight loss, he will continue to improve and may not require additional therapy. The client has been intolerant of at least 3 different types of statins in the past.

## 2016-12-25 ENCOUNTER — Other Ambulatory Visit: Payer: Self-pay | Admitting: Cardiology

## 2016-12-26 ENCOUNTER — Ambulatory Visit (INDEPENDENT_AMBULATORY_CARE_PROVIDER_SITE_OTHER): Payer: BLUE CROSS/BLUE SHIELD | Admitting: Nurse Practitioner

## 2016-12-26 ENCOUNTER — Other Ambulatory Visit: Payer: Self-pay

## 2016-12-26 ENCOUNTER — Telehealth: Payer: Self-pay

## 2016-12-26 ENCOUNTER — Encounter: Payer: Self-pay | Admitting: Nurse Practitioner

## 2016-12-26 DIAGNOSIS — E785 Hyperlipidemia, unspecified: Secondary | ICD-10-CM | POA: Diagnosis not present

## 2016-12-26 DIAGNOSIS — Z86010 Personal history of colonic polyps: Secondary | ICD-10-CM

## 2016-12-26 DIAGNOSIS — I2119 ST elevation (STEMI) myocardial infarction involving other coronary artery of inferior wall: Secondary | ICD-10-CM | POA: Diagnosis not present

## 2016-12-26 DIAGNOSIS — R001 Bradycardia, unspecified: Secondary | ICD-10-CM | POA: Diagnosis not present

## 2016-12-26 MED ORDER — PEG 3350-KCL-NA BICARB-NACL 420 G PO SOLR: 4000.0000 mL | ORAL | 0 refills | Status: DC

## 2016-12-26 NOTE — Progress Notes (Signed)
cc'ed to pcp °

## 2016-12-26 NOTE — Assessment & Plan Note (Signed)
The patient has 2 tubular adenomas on his last colonoscopy in August this year. However, this is a poor prep exam and was limited and could've missed a flat lesion. Given his tubular adenoma and poor prep the patient needs a two-month repeat colonoscopy. This point we will schedule him for an extended prep including Linzess 145 g for 4 days prior to procedure, 2 days clear liquids, 1-1/2 preps to promote adequate prep.  Proceed with TCS with Dr. Gala Romney in near future: the risks, benefits, and alternatives have been discussed with the patient in detail. The patient states understanding and desires to proceed.  Patient is on Plavix. No other anticoagulants, anxiolytics, chronic pain medications, or antidepressants. Conscious sedation should be adequate for his procedure as it was for his last.

## 2016-12-26 NOTE — Progress Notes (Signed)
Referring Provider: Redmond School, MD Primary Care Physician:  Redmond School, MD Primary GI:  Dr. Gala Romney  Chief Complaint  Patient presents with  . Colonoscopy    PP f/u-doing fine    HPI:   Louis Bruce is a 56 y.o. male who presents for postprocedure follow-up. The patient underwent screening colonoscopy based on phone triage. The patient underwent colonoscopy 10/26/2016 which found inadequate colon prep, two 6-8 mm polyps in the sigmoid colon and transverse colon, otherwise normal. Due to inadequate prep a flat lesion may have been easily obstructed or missed. Clips were placed, no MRI until clips gone.   Surgical pathology results reviewed and polyps found to be tubular adenoma. Recommended repeat colonoscopy later this year due to adenomatous colon polyps and inadequate prep.  Today he states he's doing well overall. He's perplexed by the poor prep. States he doesn't remember having much bowel movements. He drank all his prep. He drank plenty of water. Had minimal abdominal pain after the colonoscopy which wasn't severe. Denies N/V, fever, unintentional weight loss, hematochezia, melena. Denies chest pain, dyspnea, dizziness, lightheadedness, syncope, near syncope. Denies any other upper or lower GI symptoms.  Past Medical History:  Diagnosis Date  . Anxiety   . CAD S/P percutaneous coronary angioplasty 02/2013   STEMI of inf. wall -- DES x 2 RCA (Xience Alpine DES 3.5 mmx 18 & 8 mm overlapping) and residual LAD disease ~50% on relook cath  . Dyslipidemia, goal LDL below 70   . Essential hypertension   . Former heavy tobacco smoker    Quit in December 2014  . Hypertriglyceridemia   . PVCs (premature ventricular contractions) 08/16/2015  . ST elevation myocardial infarction (STEMI) of inferior wall ([place]) 02/2013   treated with stent to RCA; Echo 08/2015 - low Normal EF 50-55%, no RWMA.    Past Surgical History:  Procedure Laterality Date  . APPENDECTOMY    . COLONOSCOPY  N/A 10/26/2016   Procedure: COLONOSCOPY;  Surgeon: Daneil Dolin, MD;  [facility];  Service: Endoscopy;  Laterality: N/A;  8:30 AM  . CORONARY ANGIOPLASTY WITH STENT PLACEMENT  02/2013   Inferior STEMI- dRCA PCI Xience Alpine DES 3.5 mm x 18 mm & 3.5 mm x 8 mm overlapping DES; residual ~50-60% mLAD  . DOPPLER ECHOCARDIOGRAPHY  12/20/2006   EF normal  . FRACTIONAL FLOW RESERVE WIRE N/A 03/02/2013   Procedure: FRACTIONAL FLOW RESERVE WIRE;  Surgeon: Lorretta Harp, MD;  Location: [facility] CATH LAB;  Service: Cardiovascular;  Laterality: N/A;  . INGUINAL HERNIA REPAIR    . LEFT HEART CATHETERIZATION WITH CORONARY ANGIOGRAM N/A 02/27/2013   Procedure: LEFT HEART CATHETERIZATION WITH CORONARY ANGIOGRAM;  Surgeon: Leonie Man, MD;  Location: [facility] CATH LAB;  Service: Cardiovascular;  Laterality: N/A;  . NM MYOVIEW LTD  12/20/2006   EF 64%  . PERCUTANEOUS CORONARY STENT INTERVENTION (PCI-S)  02/27/2013   Procedure: PERCUTANEOUS CORONARY STENT INTERVENTION (PCI-S);  Surgeon: Leonie Man, MD;  Location: [facility] CATH LAB;  Service: Cardiovascular;;  . POLYPECTOMY  10/26/2016   Procedure: POLYPECTOMY;  Surgeon: Daneil Dolin, MD;  [facility];  Service: Endoscopy;;  colon  . TONSILLECTOMY    . TRANSTHORACIC ECHOCARDIOGRAM  03/16/2013; 08/2015   a). EF roughly 50%. Otherwise relatively normal; b). normal LV size and thickness. Low normal EF 50-55%. No RWMA. Mild Ao Sclerosis.    Current Outpatient Prescriptions  Medication Sig Dispense Refill  . clopidogrel (PLAVIX) 75 MG tablet  TAKE 1 TABLET DAILY 90 tablet 3  . ezetimibe (ZETIA) 10 MG tablet TAKE 1 TABLET DAILY 90 tablet 3  . ibuprofen (ADVIL,MOTRIN) 800 MG tablet Take 800 mg by mouth daily as needed for moderate pain.    . Multiple Vitamin (MULTIVITAMIN WITH MINERALS) TABS tablet Take 1 tablet by mouth daily.    . nitroGLYCERIN (NITROSTAT) 0.4 MG SL tablet PLACE 1 TABLET UNDER THE TONGUE EVERY 5 MINUTES FOR UP TO 3 DOSES AS  NEEDED FOR CHEST PAIN (Patient not taking: Reported on 12/26/2016) 25 tablet 5   No current facility-administered medications for this visit.     Allergies as of 12/26/2016 - Review Complete 12/26/2016  Allergen Reaction Noted  . Statins Other (See Comments) 08/16/2015    Family History  Problem Relation Age of Onset  . Cancer Mother   . Heart attack Mother   . Cancer Father   . Cancer Brother   . Colon cancer Neg Hx     Social History   Social History  . Marital status: Married    Spouse name: N/A  . Number of children: N/A  . Years of education: N/A   Social History Main Topics  . Smoking status: Current Every Day Smoker    Packs/day: 1.00    Years: 35.00    Start date: 05/31/2014  . Smokeless tobacco: Never Used  . Alcohol use No  . Drug use: No  . Sexual activity: Yes   Other Topics Concern  . None   Social History Narrative   He is married. 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 He was eager to leave the hospital shortly after his MI   He quit smoking in December 2014. He does have occasional shots of liquor.   He is not as active as he would like to be. Most of the time as his arthritis that stops him.    Review of Systems: General: Negative for anorexia, weight loss, fever, chills, fatigue, weakness. ENT: Negative for hoarseness, difficulty swallowing. CV: Negative for chest pain, angina, palpitations, peripheral edema.  Respiratory: Negative for dyspnea at rest, cough, sputum, wheezing.  GI: See history of present illness. Endo: Negative for unusual weight change.  Heme: Negative for bruising or bleeding. Allergy: Negative for rash or hives.   Physical Exam: BP (!) 133/92   Pulse 63   Temp 98.3 F (36.8 C) (Oral)   Ht 5\' 10"  (1.778 m)   Wt 198 lb 3.2 oz (89.9 kg)   BMI 28.44 kg/m  General:   Alert and oriented. Pleasant and cooperative. Well-nourished and well-developed.  Eyes:  Without icterus, sclera clear  and conjunctiva pink.  Ears:  Normal auditory acuity. Cardiovascular:  S1, S2 present without murmurs appreciated. Extremities without clubbing or edema. Respiratory:  Clear to auscultation bilaterally. No wheezes, rales, or rhonchi. No distress.  Gastrointestinal:  +BS, soft, non-tender and non-distended. No HSM noted. No guarding or rebound. No masses appreciated.  Rectal:  Deferred  Musculoskalatal:  Symmetrical without gross deformities. Neurologic:  Alert and oriented x4;  grossly normal neurologically. Psych:  Alert and cooperative. Normal mood and affect. Heme/Lymph/Immune: No excessive bruising noted.    12/26/2016 8:13 AM   Disclaimer: This note was dictated with voice recognition software. Similar sounding words can inadvertently be transcribed and may not be corrected upon review.

## 2016-12-26 NOTE — H&P (View-Only) (Signed)
Referring Provider: Redmond School, MD Primary Care Physician:  Redmond School, MD Primary GI:  Dr. Gala Romney  Chief Complaint  Patient presents with  . Colonoscopy    PP f/u-doing fine    HPI:   Louis Bruce is a 56 y.o. male who presents for postprocedure follow-up. The patient underwent screening colonoscopy based on phone triage. The patient underwent colonoscopy 10/26/2016 which found inadequate colon prep, two 6-8 mm polyps in the sigmoid colon and transverse colon, otherwise normal. Due to inadequate prep a flat lesion may have been easily obstructed or missed. Clips were placed, no MRI until clips gone.   Surgical pathology results reviewed and polyps found to be tubular adenoma. Recommended repeat colonoscopy later this year due to adenomatous colon polyps and inadequate prep.  Today he states he's doing well overall. He's perplexed by the poor prep. States he doesn't remember having much bowel movements. He drank all his prep. He drank plenty of water. Had minimal abdominal pain after the colonoscopy which wasn't severe. Denies N/V, fever, unintentional weight loss, hematochezia, melena. Denies chest pain, dyspnea, dizziness, lightheadedness, syncope, near syncope. Denies any other upper or lower GI symptoms.  Past Medical History:  Diagnosis Date  . Anxiety   . CAD S/P percutaneous coronary angioplasty 02/2013   STEMI of inf. wall -- DES x 2 RCA (Xience Alpine DES 3.5 mmx 18 & 8 mm overlapping) and residual LAD disease ~50% on relook cath  . Dyslipidemia, goal LDL below 70   . Essential hypertension   . Former heavy tobacco smoker    Quit in December 2014  . Hypertriglyceridemia   . PVCs (premature ventricular contractions) 08/16/2015  . ST elevation myocardial infarction (STEMI) of inferior wall ([place]) 02/2013   treated with stent to RCA; Echo 08/2015 - low Normal EF 50-55%, no RWMA.    Past Surgical History:  Procedure Laterality Date  . APPENDECTOMY    . COLONOSCOPY  N/A 10/26/2016   Procedure: COLONOSCOPY;  Surgeon: Daneil Dolin, MD;  Location: [facility];  Service: Endoscopy;  Laterality: N/A;  8:30 AM  . CORONARY ANGIOPLASTY WITH STENT PLACEMENT  02/2013   Inferior STEMI- dRCA PCI Xience Alpine DES 3.5 mm x 18 mm & 3.5 mm x 8 mm overlapping DES; residual ~50-60% mLAD  . DOPPLER ECHOCARDIOGRAPHY  12/20/2006   EF normal  . FRACTIONAL FLOW RESERVE WIRE N/A 03/02/2013   Procedure: FRACTIONAL FLOW RESERVE WIRE;  Surgeon: Lorretta Harp, MD;  Location: [facility] CATH LAB;  Service: Cardiovascular;  Laterality: N/A;  . INGUINAL HERNIA REPAIR    . LEFT HEART CATHETERIZATION WITH CORONARY ANGIOGRAM N/A 02/27/2013   Procedure: LEFT HEART CATHETERIZATION WITH CORONARY ANGIOGRAM;  Surgeon: Leonie Man, MD;  Location: [facility] CATH LAB;  Service: Cardiovascular;  Laterality: N/A;  . NM MYOVIEW LTD  12/20/2006   EF 64%  . PERCUTANEOUS CORONARY STENT INTERVENTION (PCI-S)  02/27/2013   Procedure: PERCUTANEOUS CORONARY STENT INTERVENTION (PCI-S);  Surgeon: Leonie Man, MD;  Location: [facility] CATH LAB;  Service: Cardiovascular;;  . POLYPECTOMY  10/26/2016   Procedure: POLYPECTOMY;  Surgeon: Daneil Dolin, MD;  Location: [facility];  Service: Endoscopy;;  colon  . TONSILLECTOMY    . TRANSTHORACIC ECHOCARDIOGRAM  03/16/2013; 08/2015   a). EF roughly 50%. Otherwise relatively normal; b). normal LV size and thickness. Low normal EF 50-55%. No RWMA. Mild Ao Sclerosis.    Current Outpatient Prescriptions  Medication Sig Dispense Refill  . clopidogrel (PLAVIX) 75 MG tablet  TAKE 1 TABLET DAILY 90 tablet 3  . ezetimibe (ZETIA) 10 MG tablet TAKE 1 TABLET DAILY 90 tablet 3  . ibuprofen (ADVIL,MOTRIN) 800 MG tablet Take 800 mg by mouth daily as needed for moderate pain.    . Multiple Vitamin (MULTIVITAMIN WITH MINERALS) TABS tablet Take 1 tablet by mouth daily.    . nitroGLYCERIN (NITROSTAT) 0.4 MG SL tablet PLACE 1 TABLET UNDER THE TONGUE EVERY 5 MINUTES FOR UP TO 3 DOSES AS  NEEDED FOR CHEST PAIN (Patient not taking: Reported on 12/26/2016) 25 tablet 5   No current facility-administered medications for this visit.     Allergies as of 12/26/2016 - Review Complete 12/26/2016  Allergen Reaction Noted  . Statins Other (See Comments) 08/16/2015    Family History  Problem Relation Age of Onset  . Cancer Mother   . Heart attack Mother   . Cancer Father   . Cancer Brother   . Colon cancer Neg Hx     Social History   Social History  . Marital status: Married    Spouse name: N/A  . Number of children: N/A  . Years of education: N/A   Social History Main Topics  . Smoking status: Current Every Day Smoker    Packs/day: 1.00    Years: 35.00    Start date: 05/31/2014  . Smokeless tobacco: Never Used  . Alcohol use No  . Drug use: No  . Sexual activity: Yes   Other Topics Concern  . None   Social History Narrative   He is married. 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 He was eager to leave the hospital shortly after his MI   He quit smoking in December 2014. He does have occasional shots of liquor.   He is not as active as he would like to be. Most of the time as his arthritis that stops him.    Review of Systems: General: Negative for anorexia, weight loss, fever, chills, fatigue, weakness. ENT: Negative for hoarseness, difficulty swallowing. CV: Negative for chest pain, angina, palpitations, peripheral edema.  Respiratory: Negative for dyspnea at rest, cough, sputum, wheezing.  GI: See history of present illness. Endo: Negative for unusual weight change.  Heme: Negative for bruising or bleeding. Allergy: Negative for rash or hives.   Physical Exam: BP (!) 133/92   Pulse 63   Temp 98.3 F (36.8 C) (Oral)   Ht 5\' 10"  (1.778 m)   Wt 198 lb 3.2 oz (89.9 kg)   BMI 28.44 kg/m  General:   Alert and oriented. Pleasant and cooperative. Well-nourished and well-developed.  Eyes:  Without icterus, sclera clear  and conjunctiva pink.  Ears:  Normal auditory acuity. Cardiovascular:  S1, S2 present without murmurs appreciated. Extremities without clubbing or edema. Respiratory:  Clear to auscultation bilaterally. No wheezes, rales, or rhonchi. No distress.  Gastrointestinal:  +BS, soft, non-tender and non-distended. No HSM noted. No guarding or rebound. No masses appreciated.  Rectal:  Deferred  Musculoskalatal:  Symmetrical without gross deformities. Neurologic:  Alert and oriented x4;  grossly normal neurologically. Psych:  Alert and cooperative. Normal mood and affect. Heme/Lymph/Immune: No excessive bruising noted.    12/26/2016 8:13 AM   Disclaimer: This note was dictated with voice recognition software. Similar sounding words can inadvertently be transcribed and may not be corrected upon review.

## 2016-12-26 NOTE — Patient Instructions (Signed)
1. We will schedule your colonoscopy for you 2. Further recommendations to be based on the results of your procedure 3. Call if you have any questions or concerns

## 2016-12-26 NOTE — Telephone Encounter (Signed)
Pt needed 1.5 preps for upcoming colonoscopy. Called [facility] and spoke to [place]. She advised that pt would need to pick up one jug then they would do a refill for him to pick-up 2nd jug at a different time. Insurance will not pay for him to get 2 jugs at one time. Called and informed pt.

## 2016-12-27 LAB — COMPREHENSIVE METABOLIC PANEL
ALK PHOS: 113 IU/L (ref 39–117)
ALT: 56 IU/L — AB (ref 0–44)
AST: 38 IU/L (ref 0–40)
Albumin/Globulin Ratio: 2 (ref 1.2–2.2)
Albumin: 4.5 g/dL (ref 3.5–5.5)
BUN/Creatinine Ratio: 11 (ref 9–20)
BUN: 11 mg/dL (ref 6–24)
Bilirubin Total: 0.5 mg/dL (ref 0.0–1.2)
CO2: 26 mmol/L (ref 20–29)
CREATININE: 1 mg/dL (ref 0.76–1.27)
Calcium: 9.6 mg/dL (ref 8.7–10.2)
Chloride: 102 mmol/L (ref 96–106)
GFR calc Af Amer: 96 mL/min/{1.73_m2} (ref >59)
GFR calc non Af Amer: 83 mL/min/{1.73_m2} (ref >59)
GLOBULIN, TOTAL: 2.2 g/dL (ref 1.5–4.5)
GLUCOSE: 99 mg/dL (ref 65–99)
Potassium: 4.5 mmol/L (ref 3.5–5.2)
Sodium: 140 mmol/L (ref 134–144)
Total Protein: 6.7 g/dL (ref 6.0–8.5)

## 2016-12-27 LAB — LIPID PANEL
Chol/HDL Ratio: 2.7 ratio (ref 0.0–5.0)
Cholesterol, Total: 125 mg/dL (ref 100–199)
HDL: 46 mg/dL (ref >39)
LDL CALC: 67 mg/dL (ref 0–99)
Triglycerides: 61 mg/dL (ref 0–149)
VLDL CHOLESTEROL CAL: 12 mg/dL (ref 5–40)

## 2017-01-17 ENCOUNTER — Telehealth: Payer: Self-pay | Admitting: *Deleted

## 2017-01-17 NOTE — Telephone Encounter (Signed)
Spoke with patient and was ok with procedure time for 01/15/17 to be moved to 12:45pm. Went over new directions for day of procedure for prep. 5 hrs prior to procedure changed to 7:45am, 3 hrs prior to procedure changes to 9:45am. He verbalized understanding and needed nothing further

## 2017-01-18 ENCOUNTER — Ambulatory Visit (HOSPITAL_COMMUNITY)
Admission: RE | Admit: 2017-01-18 | Discharge: 2017-01-18 | Disposition: A | Discharge status: Home / Self Care | Payer: BLUE CROSS/BLUE SHIELD | Source: Ambulatory Visit | Attending: Internal Medicine | Admitting: Internal Medicine

## 2017-01-18 ENCOUNTER — Encounter (HOSPITAL_COMMUNITY)
Admission: RE | Disposition: A | Discharge status: Home / Self Care | Payer: Self-pay | Source: Ambulatory Visit | Attending: Internal Medicine

## 2017-01-18 ENCOUNTER — Encounter (HOSPITAL_COMMUNITY): Payer: Self-pay | Admitting: *Deleted

## 2017-01-18 ENCOUNTER — Other Ambulatory Visit: Payer: Self-pay

## 2017-01-18 DIAGNOSIS — K5730 Diverticulosis of large intestine without perforation or abscess without bleeding: Secondary | ICD-10-CM | POA: Insufficient documentation

## 2017-01-18 DIAGNOSIS — E781 Pure hyperglyceridemia: Secondary | ICD-10-CM | POA: Insufficient documentation

## 2017-01-18 DIAGNOSIS — Z1211 Encounter for screening for malignant neoplasm of colon: Secondary | ICD-10-CM | POA: Diagnosis not present

## 2017-01-18 DIAGNOSIS — Z8249 Family history of ischemic heart disease and other diseases of the circulatory system: Secondary | ICD-10-CM | POA: Diagnosis not present

## 2017-01-18 DIAGNOSIS — Z888 Allergy status to other drugs, medicaments and biological substances status: Secondary | ICD-10-CM | POA: Diagnosis not present

## 2017-01-18 DIAGNOSIS — Z86010 Personal history of colonic polyps: Secondary | ICD-10-CM | POA: Insufficient documentation

## 2017-01-18 DIAGNOSIS — D122 Benign neoplasm of ascending colon: Secondary | ICD-10-CM | POA: Insufficient documentation

## 2017-01-18 DIAGNOSIS — Z7902 Long term (current) use of antithrombotics/antiplatelets: Secondary | ICD-10-CM | POA: Diagnosis not present

## 2017-01-18 DIAGNOSIS — Z79899 Other long term (current) drug therapy: Secondary | ICD-10-CM | POA: Diagnosis not present

## 2017-01-18 DIAGNOSIS — I252 Old myocardial infarction: Secondary | ICD-10-CM | POA: Insufficient documentation

## 2017-01-18 DIAGNOSIS — I2510 Atherosclerotic heart disease of native coronary artery without angina pectoris: Secondary | ICD-10-CM | POA: Diagnosis not present

## 2017-01-18 DIAGNOSIS — Z955 Presence of coronary angioplasty implant and graft: Secondary | ICD-10-CM | POA: Diagnosis not present

## 2017-01-18 DIAGNOSIS — M1990 Unspecified osteoarthritis, unspecified site: Secondary | ICD-10-CM | POA: Diagnosis not present

## 2017-01-18 HISTORY — PX: COLONOSCOPY: SHX5424

## 2017-01-18 HISTORY — PX: POLYPECTOMY: SHX5525

## 2017-01-18 SURGERY — COLONOSCOPY
Anesthesia: Moderate Sedation

## 2017-01-18 MED ORDER — SODIUM CHLORIDE 0.9 % IV SOLN
INTRAVENOUS | Status: DC
  Administered 2017-01-18: 1000 mL via INTRAVENOUS

## 2017-01-18 MED ORDER — ONDANSETRON HCL 4 MG/2ML IJ SOLN
INTRAMUSCULAR | Status: AC
  Filled 2017-01-18: qty 2

## 2017-01-18 MED ORDER — MIDAZOLAM HCL 5 MG/5ML IJ SOLN
INTRAMUSCULAR | Status: DC | PRN
  Administered 2017-01-18 (×2): 2 mg via INTRAVENOUS
  Administered 2017-01-18: 1 mg via INTRAVENOUS

## 2017-01-18 MED ORDER — MIDAZOLAM HCL 5 MG/5ML IJ SOLN
INTRAMUSCULAR | Status: AC
  Filled 2017-01-18: qty 10

## 2017-01-18 MED ORDER — MEPERIDINE HCL 100 MG/ML IJ SOLN
INTRAMUSCULAR | Status: DC | PRN
  Administered 2017-01-18: 50 mg via INTRAVENOUS
  Administered 2017-01-18: 25 mg via INTRAVENOUS
  Administered 2017-01-18: 50 mg via INTRAVENOUS

## 2017-01-18 MED ORDER — ONDANSETRON HCL 4 MG/2ML IJ SOLN
INTRAMUSCULAR | Status: DC | PRN
  Administered 2017-01-18: 4 mg via INTRAVENOUS

## 2017-01-18 MED ORDER — MEPERIDINE HCL 100 MG/ML IJ SOLN
INTRAMUSCULAR | Status: AC
  Filled 2017-01-18: qty 2

## 2017-01-18 NOTE — Discharge Instructions (Signed)
Polyp and diverticulosis information provided  No future MRI until colon Clips gone  Further recommendations to follow pending review of pathology report Colonoscopy Discharge Instructions  Read the instructions outlined below and refer to this sheet in the next few weeks. These discharge instructions provide you with general information on caring for yourself after you leave the hospital. Your doctor may also give you specific instructions. While your treatment has been planned according to the most current medical practices available, unavoidable complications occasionally occur. If you have any problems or questions after discharge, call Dr. Gala Romney at [phone]. ACTIVITY  You may resume your regular activity, but move at a slower pace for the next 24 hours.   Take frequent rest periods for the next 24 hours.   Walking will help get rid of the air and reduce the bloated feeling in your belly (abdomen).   No driving for 24 hours (because of the medicine (anesthesia) used during the test).    Do not sign any important legal documents or operate any machinery for 24 hours (because of the anesthesia used during the test).  NUTRITION  Drink plenty of fluids.   You may resume your normal diet as instructed by your doctor.   Begin with a light meal and progress to your normal diet. Heavy or fried foods are harder to digest and may make you feel sick to your stomach (nauseated).   Avoid alcoholic beverages for 24 hours or as instructed.  MEDICATIONS  You may resume your normal medications unless your doctor tells you otherwise.  WHAT YOU CAN EXPECT TODAY  Some feelings of bloating in the abdomen.   Passage of more gas than usual.   Spotting of blood in your stool or on the toilet paper.  IF YOU HAD POLYPS REMOVED DURING THE COLONOSCOPY:  No aspirin products for 7 days or as instructed.   No alcohol for 7 days or as instructed.   Eat a soft diet for the next 24 hours.  FINDING  OUT THE RESULTS OF YOUR TEST Not all test results are available during your visit. If your test results are not back during the visit, make an appointment with your caregiver to find out the results. Do not assume everything is normal if you have not heard from your caregiver or the medical facility. It is important for you to follow up on all of your test results.  SEEK IMMEDIATE MEDICAL ATTENTION IF:  You have more than a spotting of blood in your stool.   Your belly is swollen (abdominal distention).   You are nauseated or vomiting.   You have a temperature over 101.   You have abdominal pain or discomfort that is severe or gets worse throughout the day.    Diverticulosis Diverticulosis is a condition that develops when small pouches (diverticula) form in the wall of the large intestine (colon). The colon is where water is absorbed and stool is formed. The pouches form when the inside layer of the colon pushes through weak spots in the outer layers of the colon. You may have a few pouches or many of them. What are the causes? The cause of this condition is not known. What increases the risk? The following factors may make you more likely to develop this condition:  Being older than age 63. Your risk for this condition increases with age. Diverticulosis is rare among people younger than age 38. By age 35, many people have it.  Eating a low-fiber diet.  Having  frequent constipation.  Being overweight.  Not getting enough exercise.  Smoking.  Taking over-the-counter pain medicines, like aspirin and ibuprofen.  Having a family history of diverticulosis.  What are the signs or symptoms? In most people, there are no symptoms of this condition. If you do have symptoms, they may include:  Bloating.  Cramps in the abdomen.  Constipation or diarrhea.  Pain in the lower left side of the abdomen.  How is this diagnosed? This condition is most often diagnosed during an exam  for other colon problems. Because diverticulosis usually has no symptoms, it often cannot be diagnosed independently. This condition may be diagnosed by:  Using a flexible scope to examine the colon (colonoscopy).  Taking an X-ray of the colon after dye has been put into the colon (barium enema).  Doing a CT scan.  How is this treated? You may not need treatment for this condition if you have never developed an infection related to diverticulosis. If you have had an infection before, treatment may include:  Eating a high-fiber diet. This may include eating more fruits, vegetables, and grains.  Taking a fiber supplement.  Taking a live bacteria supplement (probiotic).  Taking medicine to relax your colon.  Taking antibiotic medicines.  Follow these instructions at home:  Drink 6-8 glasses of water or more each day to prevent constipation.  Try not to strain when you have a bowel movement.  If you have had an infection before: ? Eat more fiber as directed by your health care provider or your diet and nutrition specialist (dietitian). ? Take a fiber supplement or probiotic, if your health care provider approves.  Take over-the-counter and prescription medicines only as told by your health care provider.  If you were prescribed an antibiotic, take it as told by your health care provider. Do not stop taking the antibiotic even if you start to feel better.  Keep all follow-up visits as told by your health care provider. This is important. Contact a health care provider if:  You have pain in your abdomen.  You have bloating.  You have cramps.  You have not had a bowel movement in 3 days. Get help right away if:  Your pain gets worse.  Your bloating becomes very bad.  You have a fever or chills, and your symptoms suddenly get worse.  You vomit.  You have bowel movements that are bloody or black.  You have bleeding from your rectum. Summary  Diverticulosis is a  condition that develops when small pouches (diverticula) form in the wall of the large intestine (colon).  You may have a few pouches or many of them.  This condition is most often diagnosed during an exam for other colon problems.  If you have had an infection related to diverticulosis, treatment may include increasing the fiber in your diet, taking supplements, or taking medicines. This information is not intended to replace advice given to you by your health care provider. Make sure you discuss any questions you have with your health care provider. Document Released: 11/24/2003 Document Revised: 01/16/2016 Document Reviewed: 01/16/2016 Elsevier Interactive Patient Education  2017 [employer].   Colon Polyps Polyps are tissue growths inside the body. Polyps can grow in many places, including the large intestine (colon). A polyp may be a round bump or a mushroom-shaped growth. You could have one polyp or several. Most colon polyps are noncancerous (benign). However, some colon polyps can become cancerous over time. What are the causes? The exact cause  of colon polyps is not known. What increases the risk? This condition is more likely to develop in people who:  Have a family history of colon cancer or colon polyps.  Are older than 65 or older than 45 if they are African American.  Have inflammatory bowel disease, such as ulcerative colitis or Crohn disease.  Are overweight.  Smoke cigarettes.  Do not get enough exercise.  Drink too much alcohol.  Eat a diet that is: ? High in fat and red meat. ? Low in fiber.  Had childhood cancer that was treated with abdominal radiation.  What are the signs or symptoms? Most polyps do not cause symptoms. If you have symptoms, they may include:  Blood coming from your rectum when having a bowel movement.  Blood in your stool.The stool may look dark red or black.  A change in bowel habits, such as constipation or diarrhea.  How is  this diagnosed? This condition is diagnosed with a colonoscopy. This is a procedure that uses a lighted, flexible scope to look at the inside of your colon. How is this treated? Treatment for this condition involves removing any polyps that are found. Those polyps will then be tested for cancer. If cancer is found, your health care provider will talk to you about options for colon cancer treatment. Follow these instructions at home: Diet  Eat plenty of fiber, such as fruits, vegetables, and whole grains.  Eat foods that are high in calcium and vitamin D, such as milk, cheese, yogurt, eggs, liver, fish, and broccoli.  Limit foods high in fat, red meats, and processed meats, such as hot dogs, sausage, bacon, and lunch meats.  Maintain a healthy weight, or lose weight if recommended by your health care provider. General instructions  Do not smoke cigarettes.  Do not drink alcohol excessively.  Keep all follow-up visits as told by your health care provider. This is important. This includes keeping regularly scheduled colonoscopies. Talk to your health care provider about when you need a colonoscopy.  Exercise every day or as told by your health care provider. Contact a health care provider if:  You have new or worsening bleeding during a bowel movement.  You have new or increased blood in your stool.  You have a change in bowel habits.  You unexpectedly lose weight. This information is not intended to replace advice given to you by your health care provider. Make sure you discuss any questions you have with your health care provider. Document Released: 11/23/2003 Document Revised: 08/04/2015 Document Reviewed: 01/17/2015 Elsevier Interactive Patient Education  [employer].

## 2017-01-18 NOTE — Interval H&P Note (Signed)
History and Physical Interval Note:  01/18/2017 1:12 PM  Louis Bruce  has presented today for surgery, with the diagnosis of history polyps  The various methods of treatment have been discussed with the patient and family. After consideration of risks, benefits and other options for treatment, the patient has consented to  Procedure(s) with comments: COLONOSCOPY (N/A) - 2:45pm as a surgical intervention .  The patient's history has been reviewed, patient examined, no change in status, stable for surgery.  I have reviewed the patient's chart and labs.  Questions were answered to the patient's satisfaction.     Robert Rourk  No change. Surveillance colonoscopy per plan.   The risks, benefits, limitations, alternatives and imponderables have been reviewed with the patient. Questions have been answered. All parties are agreeable.

## 2017-01-18 NOTE — Op Note (Signed)
[facility] Patient Name: Louis Bruce Procedure Date: 01/18/2017 11:59 AM MRN: [id number] Date of Birth: 02/13/1961 Attending MD: Norvel Richards , MD CSN: [id number] Age: 56 Admit Type: Outpatient Procedure:                Colonoscopy Indications:              High risk colon cancer surveillance: Personal                            history of colonic polyps Providers:                Norvel Richards, MD, Lurline Del, RN, Aram Candela Referring MD:              Medicines:                Midazolam 6 mg IV, Meperidine 100 mg IV,                            Ondansetron 4 mg IV Complications:            No immediate complications. Estimated Blood Loss:     Estimated blood loss was minimal. Procedure:                Pre-Anesthesia Assessment:                           - Prior to the procedure, a History and Physical                            was performed, and patient medications and                            allergies were reviewed. The patient's tolerance of                            previous anesthesia was also reviewed. The risks                            and benefits of the procedure and the sedation                            options and risks were discussed with the patient.                            All questions were answered, and informed consent                            was obtained. Prior Anticoagulants: The patient has                            taken no previous anticoagulant or antiplatelet  agents. ASA Grade Assessment: II - A patient with                            mild systemic disease. After reviewing the risks                            and benefits, the patient was deemed in                            satisfactory condition to undergo the procedure.                           After obtaining informed consent, the colonoscope                            was passed under direct vision. Throughout the                             procedure, the patient's blood pressure, pulse, and                            oxygen saturations were monitored continuously. The                            EC-[id number] ([id number]) scope was introduced through                            the anus and advanced to the the cecum, identified                            by appendiceal orifice and ileocecal valve. The                            colonoscopy was performed without difficulty. The                            patient tolerated the procedure well. The ileocecal                            valve, appendiceal orifice, and rectum were                            photographed. The patient tolerated the procedure                            well. The quality of the bowel preparation was                            adequate. The quality of the bowel preparation was                            adequate. The entire colon was well visualized. The  ileocecal valve, appendiceal orifice, and rectum                            were photographed. Scope In: 1:22:24 PM Scope Out: 1:43:11 PM Scope Withdrawal Time: 0 hours 15 minutes 42 seconds  Total Procedure Duration: 0 hours 20 minutes 47 seconds  Findings:      The perianal and digital rectal examinations were normal.      A few small-mouthed diverticula were found in the sigmoid colon and       descending colon.      Four semi-pedunculated polyps were found in the ascending colon. The       polyps were 4 to 6 mm in size.      A 10 mm polyp was found in the ascending colon. The polyp was sessile.       The polyp was removed with a hot snare. Resection and retrieval were       complete. Estimated blood loss: none.      The exam was otherwise without abnormality on direct and retroflexion       views. These polyps were removed with a cold snare. Resection and       retrieval were complete. Estimated blood loss was minimal. one clip       placed on the hot  snare site to ensure hemostasis. One clip previously       placed was noted to still be attached in the sigmoid segment. Impression:               - Diverticulosis in the sigmoid colon and in the                            descending colon.                           - Four 4 to 6 mm polyps in the ascending colon,                            removed with a cold snare. Resected and retrieved.                           - One 10 mm polyp in the ascending colon, removed                            with a hot snare. Resected and retrieved.                           - The examination was otherwise normal on direct                            and retroflexion views. Moderate Sedation:      Moderate (conscious) sedation was administered by the endoscopy nurse       and supervised by the endoscopist. The following parameters were       monitored: oxygen saturation, heart rate, blood pressure, respiratory       rate, EKG, adequacy of pulmonary ventilation, and response to care.       Total physician intraservice time was 22 minutes. Recommendation:           -  Patient has a contact number available for                            emergencies. The signs and symptoms of potential                            delayed complications were discussed with the                            patient. Return to normal activities tomorrow.                            Written discharge instructions were provided to the                            patient.                           - Resume previous diet.                           - Continue present medications.                           - Repeat colonoscopy in 5 years for surveillance                            based on pathology results.                           - Return to GI clinic (date not yet determined). no                            MRI until clips: Procedure Code(s):        --- Professional ---                           [phone], Colonoscopy, flexible; with removal  of                            tumor(s), polyp(s), or other lesion(s) by snare                            technique                           99152, Moderate sedation services provided by the                            same physician or other qualified health care                            professional performing the diagnostic or                            therapeutic service that the sedation supports,  requiring the presence of an independent trained                            observer to assist in the monitoring of the                            patient's level of consciousness and physiological                            status; initial 15 minutes of intraservice time,                            patient age 79 years or older Diagnosis Code(s):        --- Professional ---                           D12.2, Benign neoplasm of ascending colon                           Z86.010, Personal history of colonic polyps                           K57.30, Diverticulosis of large intestine without                            perforation or abscess without bleeding CPT copyright 2016 American Medical Association. All rights reserved. The codes documented in this report are preliminary and upon coder review may  be revised to meet current compliance requirements. Cristopher Estimable. Maryagnes Carrasco, MD Norvel Richards, MD 01/18/2017 1:53:47 PM This report has been signed electronically. Number of Addenda: 0

## 2017-01-19 ENCOUNTER — Telehealth (INDEPENDENT_AMBULATORY_CARE_PROVIDER_SITE_OTHER): Payer: Self-pay | Admitting: Internal Medicine

## 2017-01-19 NOTE — Telephone Encounter (Signed)
Patient's wife called earlier this afternoon stating that her husband was having lower abdominal pain.  He underwent colonoscopy with polypectomy by Dr. Gala Romney yesterday.  Patient was eating well.  He did not experience nausea vomiting fever or bleeding.  He had not passed any flatus or feces since the procedure. I recommended patient be brought to emergency room for evaluation but patient refused.  I recommended he could try 1 g of Tylenol. I called back within an hour but he did not respond. His wife called around 6 PM and left a message for me that he was about the same and still did not want to come to emergency room.  He will if he did not feel any better by tomorrow.

## 2017-01-21 ENCOUNTER — Telehealth: Payer: Self-pay

## 2017-01-21 NOTE — Telephone Encounter (Signed)
Communications noted; glad to hear he is now doing well

## 2017-01-21 NOTE — Telephone Encounter (Signed)
FYI:Pts spouse left a voicemail on 01/19/17 stating that her husband had some abdomen pain due to his procedure.   Returned pts call on 01/21/17 @ 8:00AM, spouse reports that her husband is doing well and returned to work. Pt spouse asked to call back if needed.

## 2017-01-25 ENCOUNTER — Encounter: Payer: Self-pay | Admitting: Internal Medicine

## 2017-01-28 ENCOUNTER — Encounter (HOSPITAL_COMMUNITY): Payer: Self-pay | Admitting: Internal Medicine

## 2017-04-08 DIAGNOSIS — I10 Essential (primary) hypertension: Secondary | ICD-10-CM | POA: Diagnosis not present

## 2017-04-08 DIAGNOSIS — Z79899 Other long term (current) drug therapy: Secondary | ICD-10-CM | POA: Diagnosis not present

## 2017-04-08 DIAGNOSIS — J1089 Influenza due to other identified influenza virus with other manifestations: Secondary | ICD-10-CM | POA: Diagnosis not present

## 2017-04-08 DIAGNOSIS — Z720 Tobacco use: Secondary | ICD-10-CM | POA: Diagnosis not present

## 2017-04-08 DIAGNOSIS — J101 Influenza due to other identified influenza virus with other respiratory manifestations: Secondary | ICD-10-CM | POA: Diagnosis not present

## 2017-04-08 DIAGNOSIS — R509 Fever, unspecified: Secondary | ICD-10-CM | POA: Diagnosis not present

## 2017-04-08 DIAGNOSIS — F17200 Nicotine dependence, unspecified, uncomplicated: Secondary | ICD-10-CM | POA: Diagnosis not present

## 2017-04-08 DIAGNOSIS — I252 Old myocardial infarction: Secondary | ICD-10-CM | POA: Diagnosis not present

## 2017-07-25 DIAGNOSIS — G44009 Cluster headache syndrome, unspecified, not intractable: Secondary | ICD-10-CM | POA: Diagnosis not present

## 2017-07-25 DIAGNOSIS — Z6828 Body mass index (BMI) 28.0-28.9, adult: Secondary | ICD-10-CM | POA: Diagnosis not present

## 2017-07-25 DIAGNOSIS — E663 Overweight: Secondary | ICD-10-CM | POA: Diagnosis not present

## 2017-07-25 DIAGNOSIS — Z1389 Encounter for screening for other disorder: Secondary | ICD-10-CM | POA: Diagnosis not present

## 2017-08-01 ENCOUNTER — Other Ambulatory Visit (HOSPITAL_COMMUNITY): Payer: Self-pay | Admitting: Physician Assistant

## 2017-08-01 DIAGNOSIS — G44021 Chronic cluster headache, intractable: Secondary | ICD-10-CM

## 2017-09-17 DIAGNOSIS — Z79899 Other long term (current) drug therapy: Secondary | ICD-10-CM | POA: Diagnosis not present

## 2017-09-17 DIAGNOSIS — M545 Low back pain: Secondary | ICD-10-CM | POA: Diagnosis not present

## 2017-09-17 DIAGNOSIS — F17200 Nicotine dependence, unspecified, uncomplicated: Secondary | ICD-10-CM | POA: Diagnosis not present

## 2017-09-17 DIAGNOSIS — N200 Calculus of kidney: Secondary | ICD-10-CM | POA: Diagnosis not present

## 2017-09-17 DIAGNOSIS — I10 Essential (primary) hypertension: Secondary | ICD-10-CM | POA: Diagnosis not present

## 2017-09-17 DIAGNOSIS — Z7902 Long term (current) use of antithrombotics/antiplatelets: Secondary | ICD-10-CM | POA: Diagnosis not present

## 2017-09-17 DIAGNOSIS — I252 Old myocardial infarction: Secondary | ICD-10-CM | POA: Diagnosis not present

## 2017-09-17 DIAGNOSIS — I700 Atherosclerosis of aorta: Secondary | ICD-10-CM | POA: Diagnosis not present

## 2017-09-17 DIAGNOSIS — M533 Sacrococcygeal disorders, not elsewhere classified: Secondary | ICD-10-CM | POA: Diagnosis not present

## 2017-09-17 DIAGNOSIS — Z955 Presence of coronary angioplasty implant and graft: Secondary | ICD-10-CM | POA: Diagnosis not present

## 2017-09-17 DIAGNOSIS — K8020 Calculus of gallbladder without cholecystitis without obstruction: Secondary | ICD-10-CM | POA: Diagnosis not present

## 2017-09-17 DIAGNOSIS — N410 Acute prostatitis: Secondary | ICD-10-CM | POA: Diagnosis not present

## 2017-09-17 DIAGNOSIS — E7800 Pure hypercholesterolemia, unspecified: Secondary | ICD-10-CM | POA: Diagnosis not present

## 2017-10-03 DIAGNOSIS — N402 Nodular prostate without lower urinary tract symptoms: Secondary | ICD-10-CM | POA: Diagnosis not present

## 2017-10-03 DIAGNOSIS — E663 Overweight: Secondary | ICD-10-CM | POA: Diagnosis not present

## 2017-10-03 DIAGNOSIS — Z1389 Encounter for screening for other disorder: Secondary | ICD-10-CM | POA: Diagnosis not present

## 2017-10-03 DIAGNOSIS — I10 Essential (primary) hypertension: Secondary | ICD-10-CM | POA: Diagnosis not present

## 2017-10-03 DIAGNOSIS — N410 Acute prostatitis: Secondary | ICD-10-CM | POA: Diagnosis not present

## 2017-10-03 DIAGNOSIS — Z6827 Body mass index (BMI) 27.0-27.9, adult: Secondary | ICD-10-CM | POA: Diagnosis not present

## 2017-11-19 DIAGNOSIS — M7918 Myalgia, other site: Secondary | ICD-10-CM | POA: Diagnosis not present

## 2017-11-19 DIAGNOSIS — N419 Inflammatory disease of prostate, unspecified: Secondary | ICD-10-CM | POA: Diagnosis not present

## 2017-11-19 DIAGNOSIS — Z6828 Body mass index (BMI) 28.0-28.9, adult: Secondary | ICD-10-CM | POA: Diagnosis not present

## 2017-11-19 DIAGNOSIS — E663 Overweight: Secondary | ICD-10-CM | POA: Diagnosis not present

## 2017-11-19 DIAGNOSIS — M25552 Pain in left hip: Secondary | ICD-10-CM | POA: Diagnosis not present

## 2017-12-03 ENCOUNTER — Encounter: Payer: Self-pay | Admitting: Orthopaedic Surgery

## 2018-01-06 ENCOUNTER — Ambulatory Visit (INDEPENDENT_AMBULATORY_CARE_PROVIDER_SITE_OTHER): Payer: BLUE CROSS/BLUE SHIELD

## 2018-01-06 ENCOUNTER — Ambulatory Visit (INDEPENDENT_AMBULATORY_CARE_PROVIDER_SITE_OTHER): Payer: BLUE CROSS/BLUE SHIELD | Admitting: Orthopedic Surgery

## 2018-01-06 ENCOUNTER — Encounter: Payer: Self-pay | Admitting: Orthopedic Surgery

## 2018-01-06 VITALS — BP 145/88 | HR 64 | Ht 70.0 in | Wt 199.0 lb

## 2018-01-06 DIAGNOSIS — M545 Low back pain, unspecified: Secondary | ICD-10-CM

## 2018-01-06 DIAGNOSIS — M79605 Pain in left leg: Secondary | ICD-10-CM | POA: Diagnosis not present

## 2018-01-06 DIAGNOSIS — S79912A Unspecified injury of left hip, initial encounter: Secondary | ICD-10-CM | POA: Diagnosis not present

## 2018-01-06 DIAGNOSIS — M5126 Other intervertebral disc displacement, lumbar region: Secondary | ICD-10-CM | POA: Diagnosis not present

## 2018-01-06 MED ORDER — PREDNISONE 10 MG (48) PO TBPK
ORAL_TABLET | Freq: Every day | ORAL | 0 refills | Status: DC
Start: 2018-01-06 — End: 2018-01-20

## 2018-01-06 NOTE — Patient Instructions (Addendum)
We suspect you have a herniated disc with underlying arthritis of your lower back   The disc herniation compressed on her nerve which causes leg pain   Herniated Disk A herniated disk, also called a ruptured disk or slipped disk, occurs when a disk in the spine bulges out too far. Between the bones in the spine (vertebrae), there are oval disks that are made of a soft, spongy center that is surrounded by a tough outer ring. The disks connect your vertebrae, help your spine move, and absorb shocks from your movement. When you have a herniated disk, the spongy center of the disk bulges out or breaks through the outer ring. It can press on a nerve between the vertebrae and cause pain. This can occur anywhere in the back or neck area, but the lower back is most commonly affected. What are the causes? This condition may be caused by:  Age-related wear and tear. The spongy centers of spinal disks tend to shrink and dry out with age, which makes them more likely to herniate.  Sudden injury, such as a strain or sprain.  What increases the risk? Aging is the main risk factor for a herniated disk. Other risk factors include:  Being a man who is 63-54 years old.  Frequently doing activities that involve heavy lifting, bending, or twisting.  Frequently driving for long hours at a time.  Not getting enough exercise.  Being overweight.  Smoking.  Having a family history of back problems or herniated disks.  Being pregnant or giving birth.  Having poor nutrition.  Being tall.  What are the signs or symptoms? Symptoms may vary depending on where your herniated disk is located.  A herniated disk in the lower back may cause sharp pain in: ? Part of the arm, leg, hip, or buttocks. ? The back of the lower leg (calf). ? The lower back, spreading down through the leg into the foot (sciatica).  A herniated disk in the neck may cause dizziness and vertigo. It may also cause pain or weakness  in: ? The neck. ? The shoulder blades. ? Upper arm, forearm, or fingers.  You may also have muscle weakness. It may be difficult to: ? Lift your leg or arm. ? Stand on your toes. ? Squeeze tightly with one of your hands.  Other symptoms may include: ? Numbness or tingling in the affected areas of the hands, arms, feet, or legs. ? Inability to control when you urinate or when you have bowel movements. This is a rare but serious sign of a severe herniated disk in the lower back.  How is this diagnosed? This condition may be diagnosed based on:  Your symptoms.  Your medical history.  A physical exam. The exam may include: ? Straight-leg test. You will lie on your back while your health care provider lifts your leg, keeping your knee straight. If you feel pain, you likely have a herniated disk. ? Neurological tests. This includes checking for numbness, reflexes, muscle strength, and posture.  Imaging tests, such as: ? X-rays. ? MRI. ? CT scan. ? Electromyogram (EMG) to check the nerves that control muscles. This test may be used to determine which nerves are affected by your herniated disk.  How is this treated? Treatment for this condition may include:  A short period of rest. This is usually the first treatment. ? You may be on bed rest for up to 2 days, or you may be instructed to stay home and avoid  physical activity. ? If you have a herniated disk in your lower back, avoid sitting as much as possible. Sitting increases pressure on the disk.  Medicines. These may include: ? NSAIDs to help reduce pain and swelling. ? Muscle relaxants to prevent sudden tightening of the back muscles (back spasms). ? Prescription pain medicines, if you have severe pain.  Steroid injections in the area of the herniated disk. This can help reduce pain and swelling.  Physical therapy to strengthen your back muscles.  In many cases, symptoms go away with treatment over a period of days or  weeks. You will most likely be free of symptoms after 3-4 months. If other treatments do not help to relieve your symptoms, you may need surgery. Follow these instructions at home: Medicines  Take over-the-counter and prescription medicines only as told by your health care provider.  Do not drive or use heavy machinery while taking prescription pain medicine. Activity  Rest as directed.  After your rest period: ? Return to your normal activities and gradually begin exercising as told by your health care provider. Ask your health care provider what activities and exercises are safe for you. ? Use good posture. ? Avoid movements that cause pain. ? Do not lift anything that is heavier than 10 lb (4.5 kg) until your health care provider says this is safe. ? Do not sit or stand for long periods of time without changing positions. ? Do not sit for long periods of time without getting up and moving around.  If physical therapy was prescribed, do exercises as instructed.  Aim to strengthen muscles in your back and abdomen with exercises like crunches, swimming, or walking. General instructions  Do not use any products that contain nicotine or tobacco, such as cigarettes and e-cigarettes. These products can delay healing. If you need help quitting, ask your health care provider.  Do not wear high-heeled shoes.  Do not sleep on your belly.  If you are overweight, work with your health care provider to lose weight safely.  To prevent or treat constipation while you are taking prescription pain medicine, your health care provider may recommend that you: ? Drink enough fluid to keep your urine clear or pale yellow. ? Take over-the-counter or prescription medicines. ? Eat foods that are high in fiber, such as fresh fruits and vegetables, whole grains, and beans. ? Limit foods that are high in fat and processed sugars, such as fried and sweet foods.  Keep all follow-up visits as told by your  health care provider. This is important. How is this prevented?  Maintain a healthy weight.  Try to avoid stressful situations.  Maintain physical fitness. Do at least 150 minutes of moderate-intensity exercise each week, such as brisk walking or water aerobics.  When lifting objects: ? Keep your feet at least shoulder-width apart and tighten your abdominal muscles. ? Keep your spine neutral as you bend your knees and hips. It is important to lift using the strength of your legs, not your back. Do not lock your knees straight out. ? Always ask for help to lift heavy or awkward objects. Contact a health care provider if:  You have back pain or neck pain that does not get better after 6 weeks.  You have severe pain in your back, neck, legs, or arms.  You develop numbness, tingling, or weakness in any part of your body. Get help right away if:  You cannot move your arms or legs.  You cannot control  when you urinate or have bowel movements.  You feel dizzy or you faint.  You have shortness of breath. This information is not intended to replace advice given to you by your health care provider. Make sure you discuss any questions you have with your health care provider. Document Released: 02/24/2000 Document Revised: 10/24/2015 Document Reviewed: 10/24/2015 Elsevier Interactive Patient Education  2017 [employer].

## 2018-01-06 NOTE — Progress Notes (Signed)
Chief Complaint  Patient presents with  . Hip Pain    Left hip pain after water boarding accident early July    57 year old male was water boarding in July he landed funny and jammed his left leg into his left hip joint since that time is had pain in his left leg left hip exacerbated by coughing and bowel movements associated with weakness and numbness and tingling and severe pain intermittently in the left leg  He also had a bout of prostatitis at the same time he had a CT scan he did have some kidney stones as well as a prostatitis but the prostatitis was cured with antibiotics  He presents as for evaluation and treatment with no prior physical therapy done.  He did get relief from an intramuscular injection of steroids combined with oral steroids.  Review of Systems  Musculoskeletal: Positive for joint pain.  Neurological: Negative for tingling, sensory change, focal weakness and weakness.  All other systems reviewed and are negative.   Past Medical History:  Diagnosis Date  . Anxiety   . CAD S/P percutaneous coronary angioplasty 02/2013   STEMI of inf. wall -- DES x 2 RCA (Xience Alpine DES 3.5 mmx 18 & 8 mm overlapping) and residual LAD disease ~50% on relook cath  . Dyslipidemia, goal LDL below 70   . Essential hypertension   . Former heavy tobacco smoker    Quit in December 2014  . Hypertriglyceridemia   . PVCs (premature ventricular contractions) 08/16/2015  . ST elevation myocardial infarction (STEMI) of inferior wall ([place]) 02/2013   treated with stent to RCA; Echo 08/2015 - low Normal EF 50-55%, no RWMA.    Past Surgical History:  Procedure Laterality Date  . APPENDECTOMY    . COLONOSCOPY N/A 10/26/2016   Procedure: COLONOSCOPY;  Surgeon: Daneil Dolin, MD;  Location: [facility];  Service: Endoscopy;  Laterality: N/A;  8:30 AM  . COLONOSCOPY N/A 01/18/2017   Procedure: COLONOSCOPY;  Surgeon: Daneil Dolin, MD;  Location: [facility];  Service: Endoscopy;   Laterality: N/A;  2:45pm  . CORONARY ANGIOPLASTY WITH STENT PLACEMENT  02/2013   Inferior STEMI- dRCA PCI Xience Alpine DES 3.5 mm x 18 mm & 3.5 mm x 8 mm overlapping DES; residual ~50-60% mLAD  . DOPPLER ECHOCARDIOGRAPHY  12/20/2006   EF normal  . FRACTIONAL FLOW RESERVE WIRE N/A 03/02/2013   Procedure: FRACTIONAL FLOW RESERVE WIRE;  Surgeon: Lorretta Harp, MD;  Location: [facility] CATH LAB;  Service: Cardiovascular;  Laterality: N/A;  . INGUINAL HERNIA REPAIR    . LEFT HEART CATHETERIZATION WITH CORONARY ANGIOGRAM N/A 02/27/2013   Procedure: LEFT HEART CATHETERIZATION WITH CORONARY ANGIOGRAM;  Surgeon: Leonie Man, MD;  Location: [facility] CATH LAB;  Service: Cardiovascular;  Laterality: N/A;  . NM MYOVIEW LTD  12/20/2006   EF 64%  . PERCUTANEOUS CORONARY STENT INTERVENTION (PCI-S)  02/27/2013   Procedure: PERCUTANEOUS CORONARY STENT INTERVENTION (PCI-S);  Surgeon: Leonie Man, MD;  Location: [facility] CATH LAB;  Service: Cardiovascular;;  . POLYPECTOMY  10/26/2016   Procedure: POLYPECTOMY;  Surgeon: Daneil Dolin, MD;  Location: [facility];  Service: Endoscopy;;  colon  . POLYPECTOMY  01/18/2017   Procedure: POLYPECTOMY;  Surgeon: Daneil Dolin, MD;  Location: [facility];  Service: Endoscopy;;  transverse colon x5  . TONSILLECTOMY    . TRANSTHORACIC ECHOCARDIOGRAM  03/16/2013; 08/2015   a). EF roughly 50%. Otherwise relatively normal; b). normal LV size and thickness. Low normal  EF 50-55%. No RWMA. Mild Ao Sclerosis.    Social History   Tobacco Use  . Smoking status: Current Every Day Smoker    Packs/day: 1.00    Years: 35.00    Pack years: 35.00    Start date: 05/31/2014  . Smokeless tobacco: Never Used  Substance Use Topics  . Alcohol use: No    Alcohol/week: 10.0 standard drinks    Types: 10 Shots of liquor per week  . Drug use: No    BP (!) 145/88   Pulse 64   Ht 5\' 10"  (1.778 m)   Wt 199 lb (90.3 kg)   BMI 28.55 kg/m   His appearance is normal he is well-developed  well-groomed he is oriented x3's mood and affect are normal  He walks normally with no abnormal posturing  He is tender in his lower back the skin there is normal he has no increased muscle tension he seems to have adequate range of motion  His left and right hip have normal range of motion stability strength alignment and the skin is normal  Distally has normal pulses and temperature bilaterally with normal sensation in each leg is deep tendon reflexes are equal in each leg however he has a strong positive straight leg raise on the left coordination and balance are normal   Imaging was done at [facility] rocking him and I saw his x-rays on a disc.  He had a CT scan which was showing renal stones  I did a back and hip x-ray on him  His hip x-rays and pelvis were normal please see my report  I also did lumbar spine film and he has a facet arthritis abnormal posture increased list to the left as well as abnormal lordosis  Encounter Diagnoses  Name Primary?  . Injury of left hip, initial encounter   . Lumbar disc herniation Yes  . Lumbar pain with radiation down left leg    He has a classic herniated disc and should have an MRI  Meds ordered this encounter  Medications  . predniSONE (STERAPRED UNI-PAK 48 TAB) 10 MG (48) TBPK tablet    Sig: Take by mouth daily. 10 mg ds 12 days    Dispense:  48 tablet    Refill:  0

## 2018-01-16 ENCOUNTER — Telehealth: Payer: Self-pay | Admitting: Radiology

## 2018-01-16 DIAGNOSIS — M545 Low back pain, unspecified: Secondary | ICD-10-CM

## 2018-01-16 DIAGNOSIS — S79912A Unspecified injury of left hip, initial encounter: Secondary | ICD-10-CM

## 2018-01-16 DIAGNOSIS — M79605 Pain in left leg: Secondary | ICD-10-CM

## 2018-01-16 NOTE — Telephone Encounter (Signed)
MRI scan denied by his [employer] He needs to complete conservative treatment first.  Referral sent to Physical therapy  To you FYI

## 2018-01-17 ENCOUNTER — Telehealth: Payer: Self-pay | Admitting: Orthopedic Surgery

## 2018-01-17 NOTE — Telephone Encounter (Signed)
He had conservastive treatment this started in July ????

## 2018-01-17 NOTE — Telephone Encounter (Signed)
The insurance company wanted him to have physical therapy, he has been sent. If you want to do a peer to peer, I can get the information.

## 2018-01-17 NOTE — Telephone Encounter (Signed)
Patient/spouse, patient's designated contact on file, left message with question about MRI and what happened; had thought it was still to be scheduled. Please advise, [phone].

## 2018-01-20 ENCOUNTER — Other Ambulatory Visit: Payer: Self-pay | Admitting: Orthopedic Surgery

## 2018-01-20 DIAGNOSIS — M5126 Other intervertebral disc displacement, lumbar region: Secondary | ICD-10-CM

## 2018-01-20 MED ORDER — PREDNISONE 10 MG (48) PO TBPK: ORAL_TABLET | Freq: Every day | ORAL | 0 refills | Status: DC

## 2018-01-20 NOTE — Telephone Encounter (Signed)
Insurance did not approve study. Wife has indicated he is in a lot of pain, will try physical therapy, but wants to know if you can refill the Prednisone

## 2018-01-21 ENCOUNTER — Other Ambulatory Visit: Payer: Self-pay | Admitting: Cardiology

## 2018-01-21 MED ORDER — EZETIMIBE 10 MG PO TABS: 10.0000 mg | ORAL_TABLET | Freq: Every day | ORAL | 0 refills | Status: DC

## 2018-01-21 MED ORDER — CLOPIDOGREL BISULFATE 75 MG PO TABS: 75.0000 mg | ORAL_TABLET | Freq: Every day | ORAL | 0 refills | Status: DC

## 2018-01-21 NOTE — Telephone Encounter (Signed)
 *  STAT* If patient is at the pharmacy, call can be transferred to refill team.   1. Which medications need to be refilled? (please list name of each medication and dose if known)  ezetimibe (ZETIA) 10 MG tablet clopidogrel (PLAVIX) 75 MG tablet  2. Which pharmacy/location (including street and city if local pharmacy) is medication to be sent to? [facility]   3. Do they need a 30 day or 90 day supply? 90  Patient only has 3 days of medicine left

## 2018-01-29 ENCOUNTER — Ambulatory Visit (HOSPITAL_COMMUNITY): Payer: BLUE CROSS/BLUE SHIELD

## 2018-02-12 ENCOUNTER — Encounter (HOSPITAL_COMMUNITY): Payer: Self-pay

## 2018-02-12 ENCOUNTER — Other Ambulatory Visit: Payer: Self-pay

## 2018-02-12 ENCOUNTER — Ambulatory Visit (HOSPITAL_COMMUNITY): Payer: BLUE CROSS/BLUE SHIELD | Attending: Orthopedic Surgery

## 2018-02-12 DIAGNOSIS — M545 Low back pain: Secondary | ICD-10-CM | POA: Insufficient documentation

## 2018-02-12 DIAGNOSIS — R29898 Other symptoms and signs involving the musculoskeletal system: Secondary | ICD-10-CM | POA: Insufficient documentation

## 2018-02-12 DIAGNOSIS — G8929 Other chronic pain: Secondary | ICD-10-CM | POA: Diagnosis not present

## 2018-02-12 NOTE — Therapy (Signed)
[place], [place], [address] Phone: [phone]   Fax:  [phone]  Physical Therapy Treatment  Patient Details  Name: Louis Bruce MRN: [id number] Date of Birth: 08-20-1960 Referring Provider (PT): Carole Civil, MD   Encounter Date: 02/12/2018  PT End of Session - 02/12/18 1755    Visit Number  1    Number of Visits  13    Date for PT Re-Evaluation  03/26/18   mini-reassess on 12/24-12/26   Authorization Type  [employer] Other PPO (no auth required, 20 visit limit PT only, 0 used at eval)    Authorization Time Period  02/12/18 - 03/26/18    Authorization - Visit Number  1    Authorization - Number of Visits  20    PT Start Time  0160    PT Stop Time  1728    PT Time Calculation (min)  41 min    Activity Tolerance  Patient tolerated treatment well    Behavior During Therapy  [facility] for tasks assessed/performed       Past Medical History:  Diagnosis Date  . Anxiety   . CAD S/P percutaneous coronary angioplasty 02/2013   STEMI of inf. wall -- DES x 2 RCA (Xience Alpine DES 3.5 mmx 18 & 8 mm overlapping) and residual LAD disease ~50% on relook cath  . Dyslipidemia, goal LDL below 70   . Essential hypertension   . Former heavy tobacco smoker    Quit in December 2014  . Hypertriglyceridemia   . PVCs (premature ventricular contractions) 08/16/2015  . ST elevation myocardial infarction (STEMI) of inferior wall ([place]) 02/2013   treated with stent to RCA; Echo 08/2015 - low Normal EF 50-55%, no RWMA.    Past Surgical History:  Procedure Laterality Date  . APPENDECTOMY    . COLONOSCOPY N/A 10/26/2016   Procedure: COLONOSCOPY;  Surgeon: Daneil Dolin, MD;  [facility];  Service: Endoscopy;  Laterality: N/A;  8:30 AM  . COLONOSCOPY N/A 01/18/2017   Procedure: COLONOSCOPY;  Surgeon: Daneil Dolin, MD;  [facility];  Service: Endoscopy;  Laterality: N/A;  2:45pm  . CORONARY ANGIOPLASTY WITH  STENT PLACEMENT  02/2013   Inferior STEMI- dRCA PCI Xience Alpine DES 3.5 mm x 18 mm & 3.5 mm x 8 mm overlapping DES; residual ~50-60% mLAD  . DOPPLER ECHOCARDIOGRAPHY  12/20/2006   EF normal  . FRACTIONAL FLOW RESERVE WIRE N/A 03/02/2013   Procedure: FRACTIONAL FLOW RESERVE WIRE;  Surgeon: Lorretta Harp, MD;  Location: [facility] CATH LAB;  Service: Cardiovascular;  Laterality: N/A;  . INGUINAL HERNIA REPAIR    . LEFT HEART CATHETERIZATION WITH CORONARY ANGIOGRAM N/A 02/27/2013   Procedure: LEFT HEART CATHETERIZATION WITH CORONARY ANGIOGRAM;  Surgeon: Leonie Man, MD;  Location: [facility] CATH LAB;  Service: Cardiovascular;  Laterality: N/A;  . NM MYOVIEW LTD  12/20/2006   EF 64%  . PERCUTANEOUS CORONARY STENT INTERVENTION (PCI-S)  02/27/2013   Procedure: PERCUTANEOUS CORONARY STENT INTERVENTION (PCI-S);  Surgeon: Leonie Man, MD;  Location: [facility] CATH LAB;  Service: Cardiovascular;;  . POLYPECTOMY  10/26/2016   Procedure: POLYPECTOMY;  Surgeon: Daneil Dolin, MD;  [facility];  Service: Endoscopy;;  colon  . POLYPECTOMY  01/18/2017   Procedure: POLYPECTOMY;  Surgeon: Daneil Dolin, MD;  [facility];  Service: Endoscopy;;  transverse colon x5  . TONSILLECTOMY    . TRANSTHORACIC ECHOCARDIOGRAM  03/16/2013; 08/2015  a). EF roughly 50%. Otherwise relatively normal; b). normal LV size and thickness. Low normal EF 50-55%. No RWMA. Mild Ao Sclerosis.    There were no vitals filed for this visit.  Subjective Assessment - 02/12/18 1705    Subjective  Patient reports he fell while wake boarding on July 4th, 2019 and has been experiencing back pain since that incident. He reports he had pain immediately but continued to wake board and water ski. He returned to work the following Monday and developed a fever of 102. He went to the ED and was found to have prostatitis and asked them to assess his back while there. He state Dr. Gerarda Fraction gave him medicine for this and he was told his back  pain was likely from that. His Prostatitis cleared up but his back pain never resolved. He reports ~ 1-2 months ago he saw Dr. Aline Brochure for his back and was going to have a CT however his insurance would not cover this. He reports he did have a steroid injection from Dr. Gerarda Fraction which helped and he was pain free for about 2 days. He then received 2 steroid packs from Dr. Aline Brochure and they helped but did not fully resolve his pain.     Limitations  Sitting;Standing;Walking;Lifting;House hold activities    Diagnostic tests  x-ray: L4-S1 facet arthritis    Patient Stated Goals  to get back to active lifestyle, wake boarding, chopping wood, yard work    Currently in Pain?  Yes    Pain Score  2     Pain Location  Back    Pain Orientation  Left    Pain Descriptors / Indicators  Stabbing;Sharp   "like an ice pick in hip"   Pain Type  Chronic pain    Pain Radiating Towards  parastesia and sharp pain sometimes down Lt LE all the way to foot    Pain Onset  More than a month ago    Pain Frequency  Constant    Aggravating Factors   transitional movements    Pain Relieving Factors  steroids       [facility] PT Assessment - 02/12/18 0001      Assessment   Medical Diagnosis  Low Back Pain    Referring Provider (PT) Carole Civil, MD   Onset Date/Surgical Date  09/12/17    Prior Therapy  none      Precautions   Precautions  None      Restrictions   Weight Bearing Restrictions  No      Balance Screen   Has the patient fallen in the past 6 months  No    Has the patient had a decrease in activity level because of a fear of falling?   No    Is the patient reluctant to leave their home because of a fear of falling?   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 He has a 1 hour commute to [facility] for work every day and sometimes needs to travel to sites that could be 4-7 hours away. If it is  further he would fly.    Leisure  wake boarding, water skiing, climb trees, hunting     Cognition   Overall Cognitive Status  Within Functional Limits for tasks assessed      Observation/Other Assessments   Focus on Therapeutic Outcomes (FOTO)   40% limited      ROM / Strength   AROM / PROM / Strength  Strength;AROM      AROM   AROM Assessment Site  Lumbar    Lumbar Quadrants Pain provocation in Lt extension quadrant, and Rt/Lt flexion quadrants   Lumbar Flexion  50    Lumbar Extension  15    Lumbar - Right Side Bend  30    Lumbar - Left Side Bend  20    Lumbar - Right Rotation  WFL    Lumbar - Left Rotation  50% decreased      Strength   Strength Assessment Site  Hip;Knee;Ankle    Right/Left Hip  Right;Left    Right Hip Flexion  5/5    Right Hip Extension  4+/5    Right Hip External Rotation   5/5    Right Hip Internal Rotation  5/5    Right Hip ABduction  5/5    Right Hip ADduction  5/5    Left Hip Flexion  4+/5    Left Hip Extension  4/5    Left Hip External Rotation  5/5    Left Hip Internal Rotation  4/5    Left Hip ABduction  4+/5    Right/Left Knee  Right;Left    Right Knee Flexion  4+/5    Right Knee Extension  5/5    Left Knee Flexion  4/5    Left Knee Extension  5/5    Right Ankle Dorsiflexion  5/5    Left Ankle Dorsiflexion  5/5      Palpation   Spinal mobility  Hypomobility and pain provocation with PA's to L4-5, L5-S1    Palpation comment  no tenderness to palpation alon sciatic nerve tract or to gluteals or paraspinal muscles      Special Tests    Special Tests  Lumbar;Sacrolliac Tests;Hip Special Tests    Lumbar Tests  Slump Test;Straight Leg Raise;FABER test    Sacroiliac Tests   Sacral Compression    Hip Special Tests   Saralyn Pilar  (FABER) Test;Other;Hip Scouring      FABER test   findings  Positive    Side  Left      Slump test   Findings  Positive    Side  Left      Straight Leg Raise   Findings  Negative    Side   Left      Pelvic Compression   Findings  Positive    Side  Left      Sacral thrust    Findings  Positive    Side  Left      Sacral Compression   Findings  Positive    Side   Left      Saralyn Pilar (FABER) Test   Findings  Positive    Side  Left      Hip Scouring   Findings  Negative    Side  Left      other   Findings  Positive    Side  Left    Comments  FADDIR       [facility] Adult PT Treatment/Exercise - 02/12/18 0001      Exercises   Exercises  Lumbar      Lumbar Exercises: Stretches   Standing Extension  10 reps;10 seconds        PT Education - 02/12/18 1806    Education Details  Educated on overall exam findings and on plan for therapy. Educated on option to participate in aquatic therapy as patient mentioned water seems to help reduce his pain. Edcuated on initial HEP.    Person(s) Educated  Patient    Methods  Explanation;Handout    Comprehension  Verbalized understanding;Returned demonstration       PT Short Term Goals - 02/12/18 1809      PT SHORT TERM GOAL #1   Title  Patient will be independent with HEP, updated PRN, to reduce pain and improve mobility to return to prior active lifestyle with less pain.    Time  2    Period  Weeks    Status  New    Target Date  02/26/18      PT SHORT TERM GOAL #2   Title  Patient will Achieve 5/5 Lt LE strength throughout with MMT to demonstrate improved functional strength to indicate reduce radicular weakness.    Time  3    Period  Weeks    Status  New    Target Date  03/05/18      PT SHORT TERM GOAL #3   Title  Patient will no longer have pain with lumbar quadrants ROM testing to indicate improve positional tolerance and reduced Lt Lumbar facet irritation to reduce pain with transitional movements.     Time  3     Period  Weeks    Status  New        PT Long Term Goals - 02/12/18 1809      PT LONG TERM GOAL #1   Title  Patient will report no pain with getting in and out of his car for the past week to indicate improv elumbar facet mobility and reduced pain during transitional movements to improve QOL and return to active lifestyle.    Time  6    Period  Weeks    Status  New    Target Date  03/26/18      PT LONG TERM GOAL #2   Title  Patient will report no pain exacerbation with chopping wood and doing yardwork to indicate improved lumbar mobility and tolerance to daily activities to return to prior function with reduced pain.     Time  6    Period  Weeks    Status  New      PT LONG TERM GOAL #3   Title  Patient will improve FOTO score to 29% limited or less to indicate improve self reported function and QOL related to decreased limitation with daily activities due to back pain.    Time  6    Period  Weeks    Status  New         Plan - 02/12/18 1757    Clinical Impression Statement  Mr. Lafavor presents to physical therapy for evaluation of low back pain. His pain is located in Lt buttocks and pt points directly to Lt PSIS to indicate location. He has been experiencing pain since a fall while wake boarding on July 4th 2019. He Reports he never had pain before this but it has not gone away and is limiting him from his active lifestyle. He is unable to participate in wood cutting, gardening, yard work, including riding his tractor, and was  unable to continue water skiing/wake boarding this past summer. 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 ~ 57 years old    Clinical Presentation  Stable    Clinical Presentation due to:  weakness, (+) slump, (+) SIJ cluster, limited ROM, FOTO, decreased activity tolerance, pain, clinical judgement    Clinical Decision Making  Low    Rehab Potential  Good     PT Frequency  2x / week    PT Duration  6 weeks    PT Treatment/Interventions  ADLs/Self Care Home Management;Aquatic Therapy;Cryotherapy;Electrical Stimulation;Iontophoresis 4mg /ml Dexamethasone;Moist Heat;Traction;Therapeutic activities;Functional mobility training;Therapeutic exercise;Balance training;Neuromuscular re-education;Patient/family education;Manual techniques;Passive range of motion;Spinal Manipulations;Joint Manipulations    PT Next Visit Plan  Review eval and goals. Follow up on HEP. Initiate PA and PPIVM's/PAIVM's for opening Lt lumbar spine. Begin sciatic nerve flossing and core stabilization exercises.    PT Home Exercise Plan  Eval: lumbar extension    Consulted and Agree with Plan of Care  Patient       Patient will benefit from skilled therapeutic intervention in order to improve the following deficits and impairments:  Abnormal gait, Decreased endurance, Decreased mobility, Difficulty walking, Hypomobility, Decreased range of motion, Decreased activity tolerance, Decreased strength, Increased fascial restricitons, Impaired flexibility, Pain, Postural dysfunction  Visit Diagnosis: Chronic left-sided low back pain, unspecified whether sciatica present  Other symptoms and signs involving the musculoskeletal system     Problem List Patient Active Problem List   Diagnosis Date Noted  . History of adenomatous polyp of colon 12/26/2016  . Bradycardia 08/16/2015  . PVCs (premature ventricular contractions) 08/16/2015  . Myalgia with arthralgia 10/07/2013  . Obesity (BMI 30-39.9) 10/07/2013  . Fatigue 04/10/2013  . Anxiety 04/10/2013  . Traumatic hematoma of left lower leg, on shin 04/10/2013  . Presence of drug coated stent in right coronary artery, 02/27/13 03/03/2013  . Dyslipidemia, goal LDL below 70 03/01/2013  . Smoker 03/01/2013  . Family history of early CAD 03/01/2013  . STEMI of inferior wall 02/27/13- RCA DES X 2 02/27/2013  . Essential hypertension  02/27/2013  . CAD S/P percutaneous coronary angioplasty 02/27/2013    Kipp Brood, PT, DPT Physical Therapist with [facility]  02/12/2018 6:10 PM    [place], [place], [address] Phone: [phone]   Fax:  [phone]  Name: Louis Bruce MRN: [id number] Date of Birth: [birth year]

## 2018-02-19 ENCOUNTER — Encounter (HOSPITAL_COMMUNITY): Payer: Self-pay

## 2018-02-19 ENCOUNTER — Ambulatory Visit (HOSPITAL_COMMUNITY): Payer: BLUE CROSS/BLUE SHIELD

## 2018-02-19 DIAGNOSIS — R29898 Other symptoms and signs involving the musculoskeletal system: Secondary | ICD-10-CM | POA: Diagnosis not present

## 2018-02-19 DIAGNOSIS — G8929 Other chronic pain: Secondary | ICD-10-CM | POA: Diagnosis not present

## 2018-02-19 DIAGNOSIS — M545 Low back pain: Secondary | ICD-10-CM | POA: Diagnosis not present

## 2018-02-19 NOTE — Patient Instructions (Signed)
Bridging    Slowly raise buttocks from floor, keeping stomach tight. Repeat 1-2 times per set. Do 10 sets per session. Do 1-2 sessions per day.  http://orth.exer.us/1096   Copyright  VHI. All rights reserved.

## 2018-02-19 NOTE — Therapy (Signed)
[place], [place], [address] Phone: [phone]   Fax:  [phone]  Physical Therapy Treatment  Patient Details  Name: Louis Bruce MRN: [id number] Date of Birth: 28-Mar-1960 Referring Provider (PT): Carole Civil, MD   Encounter Date: 02/19/2018  PT End of Session - 02/19/18 1658    Visit Number  2    Number of Visits  13    Date for PT Re-Evaluation  03/26/18   Minireassess 12/24-26/19   Authorization Type  [employer]BS Other PPO (no auth required, 20 visit limit PT only, 0 used at eval)    Authorization Time Period  02/12/18 - 03/26/18    Authorization - Visit Number  2    Authorization - Number of Visits  20    PT Start Time  6237    PT Stop Time  1728    PT Time Calculation (min)  38 min    Activity Tolerance  Patient tolerated treatment well    Behavior During Therapy  [facility] for tasks assessed/performed       Past Medical History:  Diagnosis Date  . Anxiety   . CAD S/P percutaneous coronary angioplasty 02/2013   STEMI of inf. wall -- DES x 2 RCA (Xience Alpine DES 3.5 mmx 18 & 8 mm overlapping) and residual LAD disease ~50% on relook cath  . Dyslipidemia, goal LDL below 70   . Essential hypertension   . Former heavy tobacco smoker    Quit in December 2014  . Hypertriglyceridemia   . PVCs (premature ventricular contractions) 08/16/2015  . ST elevation myocardial infarction (STEMI) of inferior wall ([place]) 02/2013   treated with stent to RCA; Echo 08/2015 - low Normal EF 50-55%, no RWMA.    Past Surgical History:  Procedure Laterality Date  . APPENDECTOMY    . COLONOSCOPY N/A 10/26/2016   Procedure: COLONOSCOPY;  Surgeon: Daneil Dolin, MD;  [facility];  Service: Endoscopy;  Laterality: N/A;  8:30 AM  . COLONOSCOPY N/A 01/18/2017   Procedure: COLONOSCOPY;  Surgeon: Daneil Dolin, MD;  [facility];  Service: Endoscopy;  Laterality: N/A;  2:45pm  . CORONARY ANGIOPLASTY WITH  STENT PLACEMENT  02/2013   Inferior STEMI- dRCA PCI Xience Alpine DES 3.5 mm x 18 mm & 3.5 mm x 8 mm overlapping DES; residual ~50-60% mLAD  . DOPPLER ECHOCARDIOGRAPHY  12/20/2006   EF normal  . FRACTIONAL FLOW RESERVE WIRE N/A 03/02/2013   Procedure: FRACTIONAL FLOW RESERVE WIRE;  Surgeon: Lorretta Harp, MD;  Location: [facility] CATH LAB;  Service: Cardiovascular;  Laterality: N/A;  . INGUINAL HERNIA REPAIR    . LEFT HEART CATHETERIZATION WITH CORONARY ANGIOGRAM N/A 02/27/2013   Procedure: LEFT HEART CATHETERIZATION WITH CORONARY ANGIOGRAM;  Surgeon: Leonie Man, MD;  Location: [facility] CATH LAB;  Service: Cardiovascular;  Laterality: N/A;  . NM MYOVIEW LTD  12/20/2006   EF 64%  . PERCUTANEOUS CORONARY STENT INTERVENTION (PCI-S)  02/27/2013   Procedure: PERCUTANEOUS CORONARY STENT INTERVENTION (PCI-S);  Surgeon: Leonie Man, MD;  Location: [facility] CATH LAB;  Service: Cardiovascular;;  . POLYPECTOMY  10/26/2016   Procedure: POLYPECTOMY;  Surgeon: Daneil Dolin, MD;  [facility];  Service: Endoscopy;;  colon  . POLYPECTOMY  01/18/2017   Procedure: POLYPECTOMY;  Surgeon: Daneil Dolin, MD;  [facility];  Service: Endoscopy;;  transverse colon x5  . TONSILLECTOMY    . TRANSTHORACIC ECHOCARDIOGRAM  03/16/2013; 08/2015  a). EF roughly 50%. Otherwise relatively normal; b). normal LV size and thickness. Low normal EF 50-55%. No RWMA. Mild Ao Sclerosis.    There were no vitals filed for this visit.  Subjective Assessment - 02/19/18 1652    Subjective  Pt reports no real discomfort.  Stated pain depends upon what movements he makes.  Reports compliance iwth HEP daily.      Patient Stated Goals  to get back to active lifestyle, wake boarding, chopping wood, yard work    Currently in Pain?  No/denies         [facility] PT Assessment - 02/19/18 0001      Assessment   Medical Diagnosis  Low Back Pain    Referring Provider (PT)  Carole Civil, MD    Onset Date/Surgical Date   09/12/17    Prior Therapy  none                   [facility] Adult PT Treatment/Exercise - 02/19/18 0001      Exercises   Exercises  Lumbar      Lumbar Exercises: Stretches   Standing Extension  10 reps;10 seconds    Prone on Elbows Stretch  1 rep;30 seconds    Prone on Elbows Stretch Limitations  reports pain in lower back    Other Lumbar Stretch Exercise  prone x 50mn      Lumbar Exercises: Seated   Other Seated Lumbar Exercises  3D thoracic excursion 5x each      Lumbar Exercises: Supine   Bent Knee Raise  10 reps;3 seconds    Bridge  10 reps      Manual Therapy   Manual Therapy  Muscle Energy Technique;Neural Stretch    Manual therapy comments  Manual complete separate than rest of tx    Muscle Energy Technique  MET for Lt SI upslip in prone wiht cough (reports pain in back during coughing).    Neural Stretch  Sciatic nerve glides in supine             PT Education - 02/19/18 1658    Education Details  Reviewed goals and assured compliance with HEP.      Person(s) Educated  Patient    Methods  Explanation;Demonstration    Comprehension  Verbalized understanding;Returned demonstration       PT Short Term Goals - 02/12/18 1809      PT SHORT TERM GOAL #1   Title  Patient will be independent with HEP, updated PRN, to reduce pain and improve mobility to return to prior active lifestyle with less pain.    Time  2    Period  Weeks    Status  New    Target Date  02/26/18      PT SHORT TERM GOAL #2   Title  Patient will Achieve 5/5 Lt LE strength throughout with MMT to demonstrate improved functional strength to indicate reduce radicular weakness.    Time  3    Period  Weeks    Status  New    Target Date  03/05/18      PT SHORT TERM GOAL #3   Title  Patient will no longer have pain with lumbar quadrants ROM testing to indicate improve positional tolerance and reduced Lt Lumbar facet irritation to reduce pain with transitional movements.     Time  3     Period  Weeks    Status  New        PT  Long Term Goals - 02/12/18 1809      PT LONG TERM GOAL #1   Title  Patient will report no pain with getting in and out of his car for the past week to indicate improv elumbar facet mobility and reduced pain during transitional movements to improve QOL and return to active lifestyle.    Time  6    Period  Weeks    Status  New    Target Date  03/26/18      PT LONG TERM GOAL #2   Title  Patient will report no pain exacerbation with chopping wood and doing yardwork to indicate improved lumbar mobility and tolerance to daily activities to return to prior function with reduced pain.     Time  6    Period  Weeks    Status  New      PT LONG TERM GOAL #3   Title  Patient will improve FOTO score to 29% limited or less to indicate improve self reported function and QOL related to decreased limitation with daily activities due to back pain.    Time  6    Period  Weeks    Status  New            Plan - 02/19/18 1722    Clinical Impression Statement  Reviewed goals and assured compliance wiht HEP.  Pt able to demonstrate and verbalize appropriate mechanics with current HEP.  Checked SI alignment with noted Lt SI upslip, MET complete to improve alignment with lumbar mobiility and core/proximal strengthening exercises following.  Pt did reports increase LBP while coughing with upslip MET, increased pain scale to 1.5/10.  Pt reports pain as well during POE though able to tolerate prone position with no c/o.      Rehab Potential  Good    PT Frequency  2x / week    PT Duration  6 weeks    PT Treatment/Interventions  ADLs/Self Care Home Management;Aquatic Therapy;Cryotherapy;Electrical Stimulation;Iontophoresis 53m/ml Dexamethasone;Moist Heat;Traction;Therapeutic activities;Functional mobility training;Therapeutic exercise;Balance training;Neuromuscular re-education;Patient/family education;Manual techniques;Passive range of motion;Spinal Manipulations;Joint  Manipulations    PT Next Visit Plan  Initiate PA and PPIVM's/PAIVM's for opening Lt lumbar spine. Begin sciatic nerve flossing and core stabilization exercises.    PT Home Exercise Plan  Eval: lumbar extension; 02/19/18: bridge       Patient will benefit from skilled therapeutic intervention in order to improve the following deficits and impairments:  Abnormal gait, Decreased endurance, Decreased mobility, Difficulty walking, Hypomobility, Decreased range of motion, Decreased activity tolerance, Decreased strength, Increased fascial restricitons, Impaired flexibility, Pain, Postural dysfunction  Visit Diagnosis: Chronic left-sided low back pain, unspecified whether sciatica present  Other symptoms and signs involving the musculoskeletal system     Problem List Patient Active Problem List   Diagnosis Date Noted  . History of adenomatous polyp of colon 12/26/2016  . Bradycardia 08/16/2015  . PVCs (premature ventricular contractions) 08/16/2015  . Myalgia with arthralgia 10/07/2013  . Obesity (BMI 30-39.9) 10/07/2013  . Fatigue 04/10/2013  . Anxiety 04/10/2013  . Traumatic hematoma of left lower leg, on shin 04/10/2013  . Presence of drug coated stent in right coronary artery, 02/27/13 03/03/2013  . Dyslipidemia, goal LDL below 70 03/01/2013  . Smoker 03/01/2013  . Family history of early CAD 03/01/2013  . STEMI of inferior wall 02/27/13- RCA DES X 2 02/27/2013  . Essential hypertension 02/27/2013  . CAD S/P percutaneous coronary angioplasty 02/27/2013   CIhor Austin LPTA; CHaring CAldona Lento  02/19/2018, 5:37 PM  [place], [place], [address] Phone: [phone]   Fax:  [phone]  Name: Louis Bruce MRN: [id number] Date of Birth: 16-Oct-1960

## 2018-02-24 ENCOUNTER — Telehealth (HOSPITAL_COMMUNITY): Payer: Self-pay

## 2018-02-24 ENCOUNTER — Ambulatory Visit (HOSPITAL_COMMUNITY): Payer: BLUE CROSS/BLUE SHIELD

## 2018-02-24 NOTE — Telephone Encounter (Signed)
Wife called to cx no reason given

## 2018-02-26 ENCOUNTER — Ambulatory Visit (HOSPITAL_COMMUNITY): Payer: BLUE CROSS/BLUE SHIELD

## 2018-02-26 ENCOUNTER — Encounter (HOSPITAL_COMMUNITY): Payer: Self-pay

## 2018-02-26 ENCOUNTER — Other Ambulatory Visit: Payer: Self-pay

## 2018-02-26 DIAGNOSIS — M545 Low back pain, unspecified: Secondary | ICD-10-CM

## 2018-02-26 DIAGNOSIS — G8929 Other chronic pain: Secondary | ICD-10-CM

## 2018-02-26 DIAGNOSIS — R29898 Other symptoms and signs involving the musculoskeletal system: Secondary | ICD-10-CM

## 2018-02-26 NOTE — Therapy (Signed)
[place], [place], [address] Phone: [phone]   Fax:  [phone]  Physical Therapy Treatment  Patient Details  Name: Louis Bruce MRN: [id number] Date of Birth: 1960-04-20 Referring Provider (PT): Carole Civil, MD   Encounter Date: 02/26/2018  PT End of Session - 02/26/18 1712    Visit Number  3    Number of Visits  13    Date for PT Re-Evaluation  03/26/18   Minireassess 12/24-26/19   Authorization Type  [employer] Other PPO (no auth required, 20 visit limit PT only, 0 used at eval)    Authorization Time Period  02/12/18 - 03/26/18    Authorization - Visit Number  3    Authorization - Number of Visits  20    PT Start Time  7782    PT Stop Time  1739    PT Time Calculation (min)  47 min    Activity Tolerance  Patient tolerated treatment well    Behavior During Therapy  [facility] for tasks assessed/performed       Past Medical History:  Diagnosis Date  . Anxiety   . CAD S/P percutaneous coronary angioplasty 02/2013   STEMI of inf. wall -- DES x 2 RCA (Xience Alpine DES 3.5 mmx 18 & 8 mm overlapping) and residual LAD disease ~50% on relook cath  . Dyslipidemia, goal LDL below 70   . Essential hypertension   . Former heavy tobacco smoker    Quit in December 2014  . Hypertriglyceridemia   . PVCs (premature ventricular contractions) 08/16/2015  . ST elevation myocardial infarction (STEMI) of inferior wall ([place]) 02/2013   treated with stent to RCA; Echo 08/2015 - low Normal EF 50-55%, no RWMA.    Past Surgical History:  Procedure Laterality Date  . APPENDECTOMY    . COLONOSCOPY N/A 10/26/2016   Procedure: COLONOSCOPY;  Surgeon: Daneil Dolin, MD;  [facility];  Service: Endoscopy;  Laterality: N/A;  8:30 AM  . COLONOSCOPY N/A 01/18/2017   Procedure: COLONOSCOPY;  Surgeon: Daneil Dolin, MD;  [facility];  Service: Endoscopy;  Laterality: N/A;  2:45pm  . CORONARY ANGIOPLASTY WITH  STENT PLACEMENT  02/2013   Inferior STEMI- dRCA PCI Xience Alpine DES 3.5 mm x 18 mm & 3.5 mm x 8 mm overlapping DES; residual ~50-60% mLAD  . DOPPLER ECHOCARDIOGRAPHY  12/20/2006   EF normal  . FRACTIONAL FLOW RESERVE WIRE N/A 03/02/2013   Procedure: FRACTIONAL FLOW RESERVE WIRE;  Surgeon: Lorretta Harp, MD;  Location: [facility] CATH LAB;  Service: Cardiovascular;  Laterality: N/A;  . INGUINAL HERNIA REPAIR    . LEFT HEART CATHETERIZATION WITH CORONARY ANGIOGRAM N/A 02/27/2013   Procedure: LEFT HEART CATHETERIZATION WITH CORONARY ANGIOGRAM;  Surgeon: Leonie Man, MD;  Location: [facility] CATH LAB;  Service: Cardiovascular;  Laterality: N/A;  . NM MYOVIEW LTD  12/20/2006   EF 64%  . PERCUTANEOUS CORONARY STENT INTERVENTION (PCI-S)  02/27/2013   Procedure: PERCUTANEOUS CORONARY STENT INTERVENTION (PCI-S);  Surgeon: Leonie Man, MD;  Location: [facility] CATH LAB;  Service: Cardiovascular;;  . POLYPECTOMY  10/26/2016   Procedure: POLYPECTOMY;  Surgeon: Daneil Dolin, MD;  [facility];  Service: Endoscopy;;  colon  . POLYPECTOMY  01/18/2017   Procedure: POLYPECTOMY;  Surgeon: Daneil Dolin, MD;  [facility];  Service: Endoscopy;;  transverse colon x5  . TONSILLECTOMY    . TRANSTHORACIC ECHOCARDIOGRAM  03/16/2013; 08/2015  a). EF roughly 50%. Otherwise relatively normal; b). normal LV size and thickness. Low normal EF 50-55%. No RWMA. Mild Ao Sclerosis.    There were no vitals filed for this visit.  Subjective Assessment - 02/26/18 1655    Subjective  Patient reports he is not having major discomfort, maybe 1/10 and that it is just annoying. He reports he is dong he HEP daily.    Limitations  Sitting;Standing;Walking;Lifting;House hold activities    Patient Stated Goals  to get back to active lifestyle, wake boarding, chopping wood, yard work    Currently in Pain?  Yes    Pain Score  1     Pain Location  Back    Pain Orientation  Left    Pain Descriptors / Indicators   Aching    Pain Type  Chronic pain    Pain Onset  More than a month ago    Pain Frequency  Constant        [facility] Adult PT Treatment/Exercise - 02/26/18 0001   Lumbar Exercises: Stretches  Lower Trunk Rotation Limitations;10 seconds (10 x bilaterally)  Lumbar Exercises: Seated  Other Seated Lumbar Exercises Lt LE Sciatic nerve glide: 10 reps, 5 sec holds at each position  Lumbar Exercises: Supine  Bent Knee Raise 15 reps;Limitations  Bridge 15 reps;2 seconds  Bent Knee Raise Limitations TrA activation, pelvic tilt  Manual Therapy  Manual Therapy Joint mobilization;Passive ROM;Manual Traction  Manual therapy comments Manual complete separate than rest of tx  Joint Mobilization PA's to L3-5, grade II, 3x 30-45 second ocsillations  Passive ROM PAIVM's for LT lumbar L4/5 facet opening in flexed position  Manual Traction 2 bouts of 3x 30 seconds for caudal distraction using mobilization band for lumbar traction      PT Education - 02/26/18 1723    Education Details  Educated on purpose of exercise throughout session and on purpose of manual interventions to improve lumbar spine mobility.    Person(s) Educated  Patient    Methods  Explanation    Comprehension  Verbalized understanding;Returned demonstration       PT Short Term Goals - 02/12/18 1809      PT SHORT TERM GOAL #1   Title  Patient will be independent with HEP, updated PRN, to reduce pain and improve mobility to return to prior active lifestyle with less pain.    Time  2    Period  Weeks    Status  New    Target Date  02/26/18      PT SHORT TERM GOAL #2   Title  Patient will Achieve 5/5 Lt LE strength throughout with MMT to demonstrate improved functional strength to indicate reduce radicular weakness.    Time  3    Period  Weeks    Status  New    Target Date  03/05/18      PT SHORT TERM GOAL #3   Title  Patient will no longer have pain with lumbar quadrants ROM testing to indicate improve positional tolerance  and reduced Lt Lumbar facet irritation to reduce pain with transitional movements.     Time  3    Period  Weeks    Status  New        PT Long Term Goals - 02/12/18 1809      PT LONG TERM GOAL #1   Title  Patient will report no pain with getting in and out of his car for the past week to indicate improv elumbar facet  mobility and reduced pain during transitional movements to improve QOL and return to active lifestyle.    Time  6    Period  Weeks    Status  New    Target Date  03/26/18      PT LONG TERM GOAL #2   Title  Patient will report no pain exacerbation with chopping wood and doing yardwork to indicate improved lumbar mobility and tolerance to daily activities to return to prior function with reduced pain.     Time  6    Period  Weeks    Status  New      PT LONG TERM GOAL #3   Title  Patient will improve FOTO score to 29% limited or less to indicate improve self reported function and QOL related to decreased limitation with daily activities due to back pain.    Time  6    Period  Weeks    Status  New        Plan - 02/26/18 1712    Clinical Impression Statement  Initiated manual interventions this session focused on opening joint space of L3/4 and L4/5 through passive arthrokinematic movements and manual traction. Patient also performed lumbar stretches with no increase in pain today and hip strengthening was continued. HE reported complete relief of pain with manual lumbar traction and following that performed seated sciatic nerve glide. This exercise recreated his Lt hip/PSIS pain with 10 repetitions and manual traction was performed again to provide pain relief. He will continue to benefit from skilled PT interventions to address core weakness and lumbar mobility.    Rehab Potential  Good    PT Frequency  2x / week    PT Duration  6 weeks    PT Treatment/Interventions  ADLs/Self Care Home Management;Aquatic Therapy;Cryotherapy;Electrical Stimulation;Iontophoresis 4mg /ml  Dexamethasone;Moist Heat;Traction;Therapeutic activities;Functional mobility training;Therapeutic exercise;Balance training;Neuromuscular re-education;Patient/family education;Manual techniques;Passive range of motion;Spinal Manipulations;Joint Manipulations    PT Next Visit Plan  Continue PPIVM's/PAIVM's for opening Lt lumbar spine. Follow up on sciatic nerve flossing response and discontinue unless performed passive. Progress core stabilization exercises. Manual traction should be performed for pain relief.    PT Home Exercise Plan  Eval: lumbar extension; 02/19/18: bridge    Consulted and Agree with Plan of Care  Patient       Patient will benefit from skilled therapeutic intervention in order to improve the following deficits and impairments:  Abnormal gait, Decreased endurance, Decreased mobility, Difficulty walking, Hypomobility, Decreased range of motion, Decreased activity tolerance, Decreased strength, Increased fascial restricitons, Impaired flexibility, Pain, Postural dysfunction  Visit Diagnosis: Chronic left-sided low back pain, unspecified whether sciatica present  Other symptoms and signs involving the musculoskeletal system     Problem List Patient Active Problem List   Diagnosis Date Noted  . History of adenomatous polyp of colon 12/26/2016  . Bradycardia 08/16/2015  . PVCs (premature ventricular contractions) 08/16/2015  . Myalgia with arthralgia 10/07/2013  . Obesity (BMI 30-39.9) 10/07/2013  . Fatigue 04/10/2013  . Anxiety 04/10/2013  . Traumatic hematoma of left lower leg, on shin 04/10/2013  . Presence of drug coated stent in right coronary artery, 02/27/13 03/03/2013  . Dyslipidemia, goal LDL below 70 03/01/2013  . Smoker 03/01/2013  . Family history of early CAD 03/01/2013  . STEMI of inferior wall 02/27/13- RCA DES X 2 02/27/2013  . Essential hypertension 02/27/2013  . CAD S/P percutaneous coronary angioplasty 02/27/2013    Kipp Brood, PT,  DPT Physical Therapist with [facility]  02/26/2018 5:44 PM    [place], [place], [address] Phone: [phone]   Fax:  [phone]  Name: Louis Bruce MRN: [id number] Date of Birth: [birth year]

## 2018-03-06 ENCOUNTER — Telehealth (HOSPITAL_COMMUNITY): Payer: Self-pay

## 2018-03-06 ENCOUNTER — Ambulatory Visit (HOSPITAL_COMMUNITY): Payer: BLUE CROSS/BLUE SHIELD

## 2018-03-06 NOTE — Telephone Encounter (Signed)
Patient's wife called and canceled all of his appts ,he is going out of town and will call us for apts when he gets back.

## 2018-03-07 ENCOUNTER — Ambulatory Visit (HOSPITAL_COMMUNITY): Payer: BLUE CROSS/BLUE SHIELD

## 2018-03-13 ENCOUNTER — Encounter (HOSPITAL_COMMUNITY): Payer: BLUE CROSS/BLUE SHIELD

## 2018-03-14 ENCOUNTER — Encounter (HOSPITAL_COMMUNITY): Payer: BLUE CROSS/BLUE SHIELD

## 2018-03-17 ENCOUNTER — Telehealth: Payer: Self-pay | Admitting: Cardiology

## 2018-03-17 ENCOUNTER — Other Ambulatory Visit: Payer: Self-pay

## 2018-03-17 DIAGNOSIS — Z79899 Other long term (current) drug therapy: Secondary | ICD-10-CM

## 2018-03-17 DIAGNOSIS — I10 Essential (primary) hypertension: Secondary | ICD-10-CM

## 2018-03-17 DIAGNOSIS — E785 Hyperlipidemia, unspecified: Secondary | ICD-10-CM | POA: Diagnosis not present

## 2018-03-17 NOTE — Telephone Encounter (Signed)
LAST LABS IN OCTOBER 2018 SO COME TO LAB AND HAVE FASTING LABS DONE, PT WIFE NOTIFIED HE WILL COME IN TASTING TOMORROW SINCE APPT IS 1-9, WIFE VERBALIZED UNDERSTANDING

## 2018-03-17 NOTE — Telephone Encounter (Signed)
New Message    Patients wife is calling to see if patient needs to have lab work prior to appointment. If so can the lab order be sent to [facility] in [place].  Please call to advise.

## 2018-03-18 ENCOUNTER — Encounter (HOSPITAL_COMMUNITY): Payer: BLUE CROSS/BLUE SHIELD

## 2018-03-18 LAB — COMPREHENSIVE METABOLIC PANEL
ALK PHOS: 76 IU/L (ref 39–117)
ALT: 30 IU/L (ref 0–44)
AST: 25 IU/L (ref 0–40)
Albumin/Globulin Ratio: 2.3 — ABNORMAL HIGH (ref 1.2–2.2)
Albumin: 4.6 g/dL (ref 3.5–5.5)
BILIRUBIN TOTAL: 0.8 mg/dL (ref 0.0–1.2)
BUN/Creatinine Ratio: 13 (ref 9–20)
BUN: 12 mg/dL (ref 6–24)
CHLORIDE: 103 mmol/L (ref 96–106)
CO2: 23 mmol/L (ref 20–29)
Calcium: 9.6 mg/dL (ref 8.7–10.2)
Creatinine, Ser: 0.94 mg/dL (ref 0.76–1.27)
GFR calc Af Amer: 103 mL/min/{1.73_m2} (ref >59)
GFR calc non Af Amer: 89 mL/min/{1.73_m2} (ref >59)
GLUCOSE: 83 mg/dL (ref 65–99)
Globulin, Total: 2 g/dL (ref 1.5–4.5)
Potassium: 4.4 mmol/L (ref 3.5–5.2)
Sodium: 139 mmol/L (ref 134–144)
Total Protein: 6.6 g/dL (ref 6.0–8.5)

## 2018-03-18 LAB — LIPID PANEL
CHOLESTEROL TOTAL: 154 mg/dL (ref 100–199)
Chol/HDL Ratio: 3.7 ratio (ref 0.0–5.0)
HDL: 42 mg/dL (ref >39)
LDL Calculated: 86 mg/dL (ref 0–99)
TRIGLYCERIDES: 132 mg/dL (ref 0–149)
VLDL Cholesterol Cal: 26 mg/dL (ref 5–40)

## 2018-03-20 ENCOUNTER — Ambulatory Visit (INDEPENDENT_AMBULATORY_CARE_PROVIDER_SITE_OTHER): Payer: BLUE CROSS/BLUE SHIELD | Admitting: Cardiology

## 2018-03-20 ENCOUNTER — Encounter (HOSPITAL_COMMUNITY): Payer: BLUE CROSS/BLUE SHIELD

## 2018-03-20 ENCOUNTER — Encounter: Payer: Self-pay | Admitting: Cardiology

## 2018-03-20 VITALS — BP 118/66 | HR 79 | Ht 70.0 in | Wt 200.0 lb

## 2018-03-20 DIAGNOSIS — E669 Obesity, unspecified: Secondary | ICD-10-CM | POA: Diagnosis not present

## 2018-03-20 DIAGNOSIS — I493 Ventricular premature depolarization: Secondary | ICD-10-CM

## 2018-03-20 DIAGNOSIS — I10 Essential (primary) hypertension: Secondary | ICD-10-CM

## 2018-03-20 DIAGNOSIS — I2510 Atherosclerotic heart disease of native coronary artery without angina pectoris: Secondary | ICD-10-CM | POA: Diagnosis not present

## 2018-03-20 DIAGNOSIS — Z9861 Coronary angioplasty status: Secondary | ICD-10-CM

## 2018-03-20 DIAGNOSIS — E785 Hyperlipidemia, unspecified: Secondary | ICD-10-CM | POA: Diagnosis not present

## 2018-03-20 DIAGNOSIS — Z955 Presence of coronary angioplasty implant and graft: Secondary | ICD-10-CM

## 2018-03-20 DIAGNOSIS — F17200 Nicotine dependence, unspecified, uncomplicated: Secondary | ICD-10-CM

## 2018-03-20 MED ORDER — ASPIRIN EC 81 MG PO TBEC
81.0000 mg | DELAYED_RELEASE_TABLET | Freq: Every day | ORAL | 3 refills | Status: AC
Start: 2018-03-20 — End: ?

## 2018-03-20 MED ORDER — EZETIMIBE 10 MG PO TABS: 10.0000 mg | ORAL_TABLET | Freq: Every day | ORAL | 3 refills | Status: DC

## 2018-03-20 MED ORDER — KRILL OIL 300 MG PO CAPS: 2.0000 | ORAL_CAPSULE | Freq: Every day | ORAL | 3 refills | Status: AC

## 2018-03-20 NOTE — Progress Notes (Signed)
PCP: Leonie Man, MD  Clinic Note: Chief Complaint  Patient presents with  . Follow-up    No major complaints.  . Coronary Artery Disease    No angina  . Hyperlipidemia    Reluctant to take any other medications, okay with Zetia.    HPI: Louis Bruce is a 58 y.o. male with a PMH below who presents today for annual f/u for CAD-PCI- Inf STEMI 02/2013..  S/p Inferior STEMI in December 2014 : occluded RCA treated with DES stent.   Mostly preserved EF of 50% by echo.   Originally on Brilinta converted to Effient for dyspnea and fatigue --> then converted to Plavix  Carvedilol dose has been reduced on several occasions due to dizziness and fatigue.  Has had issues with statins. Did not tolerate Crestor, Lipitor or simvastatin. Was on fish oil. --> scheduled to see Tommy Medal, [place] (in CV[facility] - Cardiovascular Risk Reduction clinic)  to discuss the management, but did not make it -- now on Zetia (very reluctant to take any medications.  Louis Bruce was last seen on 12/02/2015 - doing well.  Energy notably improved off of Beta Blocker. No angina, chf or palpitations. Was actively loosing weight with adjusted diet & exercise.  Recent Hospitalizations: none  Studies Personally Reviewed - (if available, images/films reviewed: From Epic Chart or Care Everywhere)  Transthoracic Echo 08/16/2015: normal LV size and thickness. Low normal EF 50-55%. No RWMA. Mild Ao Sclerosis.  Interval History: Louis Bruce returns for routine follow-up today stating that he "feels great.  He has no major complaints besides occasional skipped beats.  But he does notices that if his blood pressure goes up little bit, he feels his pulse on his cheek and forehead.  Otherwise the major complaint he has is bruising.  He denies any recurrent episodes of chest sinus pressure with rest or exertion.  He has not lost any more weight, but is stayed stable.  Energy level seems to be doing well. He has not really been  doing routine exercise, but is just very active out about.  Arthritis is really what seems to be holding him back now.  He has cut down his cigarettes to 1 cigarette a week, and hopes that by the end of the summer he will of fully quit again.  Biggest issue is just avoiding temptation.  He may have some exertional dyspnea if he rushes up a hill, but not with any routine activity.  No exertional or resting chest pain or pressure. Besides noting a few palpitations, no rapid irregular heartbeats. No syncope/near syncope or TIA shows amorous fugax. Easy bruising, but no melena, hematochezia, hematuria or epistaxis. -Definitely better with Plavix, but still if he bumps himself at all he gets a bruise.   ROS: A comprehensive was performed. Review of Systems  Constitutional: Negative for weight loss (No further loss.).  HENT: Negative for hearing loss and nosebleeds.   Eyes: Negative.   Respiratory: Negative for cough (rare morning cough), sputum production, shortness of breath and wheezing.   Cardiovascular: Positive for palpitations (Rare).       Per history of present illness  Gastrointestinal: Negative for abdominal pain and heartburn.  Genitourinary: Negative for hematuria.  Musculoskeletal: Negative for back pain, joint pain and myalgias.  Skin: Negative.   Neurological: Negative for dizziness (Sometimes positional) and weakness.  Endo/Heme/Allergies: Bruises/bleeds easily.  Psychiatric/Behavioral: Negative for depression and memory loss. The patient is not nervous/anxious and does not have insomnia.   All  other systems reviewed and are negative.  I have reviewed and (if needed) personally updated the patient's problem list, medications, allergies, past medical and surgical history, social and family history.   Past Medical History:  Diagnosis Date  . Anxiety   . CAD S/P percutaneous coronary angioplasty 02/2013   STEMI of inf. wall -- DES x 2 RCA (Xience Alpine DES 3.5 mmx 18 & 8 mm  overlapping) and residual LAD disease ~50% on relook cath  . Dyslipidemia, goal LDL below 70   . Essential hypertension   . Former heavy tobacco smoker    Quit in December 2014  . Hypertriglyceridemia   . PVCs (premature ventricular contractions) 08/16/2015  . ST elevation myocardial infarction (STEMI) of inferior wall ([place]) 02/2013   treated with stent to RCA; Echo 08/2015 - low Normal EF 50-55%, no RWMA.    Past Surgical History:  Procedure Laterality Date  . APPENDECTOMY    . COLONOSCOPY N/A 10/26/2016   Procedure: COLONOSCOPY;  Surgeon: Daneil Dolin, MD;  [facility];  Service: Endoscopy;  Laterality: N/A;  8:30 AM  . COLONOSCOPY N/A 01/18/2017   Procedure: COLONOSCOPY;  Surgeon: Daneil Dolin, MD;  [facility];  Service: Endoscopy;  Laterality: N/A;  2:45pm  . CORONARY ANGIOPLASTY WITH STENT PLACEMENT  02/2013   Inferior STEMI- dRCA PCI Xience Alpine DES 3.5 mm x 18 mm & 3.5 mm x 8 mm overlapping DES; residual ~50-60% mLAD  . DOPPLER ECHOCARDIOGRAPHY  12/20/2006   EF normal  . FRACTIONAL FLOW RESERVE WIRE N/A 03/02/2013   Procedure: FRACTIONAL FLOW RESERVE WIRE;  Surgeon: Lorretta Harp, MD;  Location: [facility] CATH LAB;  Service: Cardiovascular;  Laterality: N/A;  . INGUINAL HERNIA REPAIR    . LEFT HEART CATHETERIZATION WITH CORONARY ANGIOGRAM N/A 02/27/2013   Procedure: LEFT HEART CATHETERIZATION WITH CORONARY ANGIOGRAM;  Surgeon: Leonie Man, MD;  Location: [facility] CATH LAB;  Service: Cardiovascular;  Laterality: N/A;  . NM MYOVIEW LTD  12/20/2006   EF 64%  . PERCUTANEOUS CORONARY STENT INTERVENTION (PCI-S)  02/27/2013   Procedure: PERCUTANEOUS CORONARY STENT INTERVENTION (PCI-S);  Surgeon: Leonie Man, MD;  Location: [facility] CATH LAB;  Service: Cardiovascular;;  . POLYPECTOMY  10/26/2016   Procedure: POLYPECTOMY;  Surgeon: Daneil Dolin, MD;  [facility];  Service: Endoscopy;;  colon  . POLYPECTOMY  01/18/2017   Procedure: POLYPECTOMY;   Surgeon: Daneil Dolin, MD;  [facility];  Service: Endoscopy;;  transverse colon x5  . TONSILLECTOMY    . TRANSTHORACIC ECHOCARDIOGRAM  03/16/2013; 08/2015   a). EF roughly 50%. Otherwise relatively normal; b). normal LV size and thickness. Low normal EF 50-55%. No RWMA. Mild Ao Sclerosis.    Current Meds  Medication Sig  . ezetimibe (ZETIA) 10 MG tablet Take 1 tablet (10 mg total) by mouth daily.  . Multiple Vitamin (MULTIVITAMIN WITH MINERALS) TABS tablet Take 1 tablet by mouth daily.  . sildenafil (REVATIO) 20 MG tablet Take 20 mg by mouth as needed.  . [DISCONTINUED] clopidogrel (PLAVIX) 75 MG tablet Take 1 tablet (75 mg total) by mouth daily.  . [DISCONTINUED] ezetimibe (ZETIA) 10 MG tablet Take 1 tablet (10 mg total) by mouth daily.  . [DISCONTINUED] ibuprofen (ADVIL,MOTRIN) 800 MG tablet Take 800 mg every 8 (eight) hours as needed by mouth for moderate pain.   . [DISCONTINUED] KRILL OIL PO Take 1 capsule daily by mouth.  . [DISCONTINUED] nitroGLYCERIN (NITROSTAT) 0.4 MG SL tablet PLACE 1  TABLET UNDER THE TONGUE EVERY 5 MINUTES FOR UP TO 3 DOSES AS NEEDED FOR CHEST PAIN  . [DISCONTINUED] polyethylene glycol-electrolytes (TRILYTE) 420 g solution Take 4,000 mLs by mouth as directed.  . [DISCONTINUED] predniSONE (STERAPRED UNI-PAK 48 TAB) 10 MG (48) TBPK tablet Take by mouth daily. 10 mg ds 12 days    Allergies  Allergen Reactions  . Statins Other (See Comments)    myalgia    Social History   Tobacco Use  . Smoking status: Light Tobacco Smoker    Packs/day: 1.00    Years: 35.00    Pack years: 35.00    Start date: 05/31/2014  . Smokeless tobacco: Never Used  Substance Use Topics  . Alcohol use: No    Alcohol/week: 10.0 standard drinks    Types: 10 Shots of liquor per week  . Drug use: No   . Social History   Social History Narrative   He is married. 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  Down to 1 cig/week (Jan 2020)      He does have occasional shots of liquor.      He is not as active as he would like to be. Most of the time as his arthritis that stops him.       family history includes Cancer in his brother, father, and mother; Heart attack in his mother.  Wt Readings from Last 3 Encounters:  03/20/18 200 lb (90.7 kg)  01/06/18 199 lb (90.3 kg)  01/18/17 190 lb (86.2 kg)  05/2013 - 265 Lb.  PHYSICAL EXAM BP 118/66   Pulse 79   Ht 5\' 10"  (1.778 m)   Wt 200 lb (90.7 kg)   BMI 28.70 kg/m  Physical Exam  Constitutional: He is oriented to person, place, and time. He appears well-developed and well-nourished. No distress.  Significantly healthier appearing. He is now below the "obesity threshold."  Well-groomed.  HENT:  Head: Normocephalic and atraumatic.  Mouth/Throat: No oropharyngeal exudate.  Eyes: EOM are normal. No scleral icterus.  Neck: Normal range of motion. Neck supple. No hepatojugular reflux and no JVD present. Carotid bruit is not present. No thyromegaly present.  Cardiovascular: Regular rhythm, S1 normal, normal heart sounds and intact distal pulses.  Occasional extrasystoles (Known PVCs) are present. Bradycardia present. PMI is not displaced. Exam reveals decreased pulses (Mildly decreased pedal pulses). Exam reveals no gallop.  No murmur heard. Physiologically split S2  Pulmonary/Chest: Effort normal and breath sounds normal. He exhibits no tenderness.  Mild diffuse interstitial sounds, no wheezes rales or rhonchi  Abdominal: Soft. Bowel sounds are normal. He exhibits no distension. There is no abdominal tenderness. There is no rebound and no guarding.  Musculoskeletal: Normal range of motion.        General: No edema.  Neurological: He is alert and oriented to person, place, and time.  Grossly normal  Psychiatric: He has a normal mood and  affect. His behavior is normal. Judgment and thought content normal.  Nursing note and vitals reviewed.    Adult ECG Report  Rate: 71;  Rhythm: normal sinus rhythm, premature ventricular contractions (PVC) and ~ bigeminy / trigeminy pattern; RBBB;   Narrative Interpretation: now complete RBBB noted.    Other studies Reviewed: Additional studies/ records that were reviewed today include:  Recent Labs:  Lab Results  Component Value Date   CHOL 154 03/17/2018   HDL 42 03/17/2018   LDLCALC 86 03/17/2018   TRIG 132 03/17/2018   CHOLHDL 3.7 03/17/2018    ASSESSMENT / PLAN: Problem List Items Addressed This Visit    CAD S/P percutaneous coronary angioplasty - Primary (Chronic)    Several years out from overlapping DES PCI to the RCA in the setting of STEMI.  Well-preserved EF.  No recurrent angina or heart failure symptoms.   At this point I think were probably okay stopping Plavix and reverting back to baby aspirin. His blood pressures well controlled without any medications. Has tried multiple statins and been intolerant, is only on Zetia.      Relevant Medications   ezetimibe (ZETIA) 10 MG tablet   aspirin EC 81 MG tablet   Other Relevant Orders   EKG 12-Lead (Completed)   Lipid panel   Hepatic function panel   Dyslipidemia, goal LDL below 70 (Chronic)    Most recent lipids show relatively stable levels, but LDL is still 86.  He is tolerating Zetia, but really does not like taking medications. At this point he would like to try to adjust his diet again and get back into a good routine.  We will increase his krill oil to 2 tabs daily, and reassess in 5 to 6 months.  If not at goal at that time, then I think we need to readdress the concept of Repatha/Praluent.      Relevant Medications   ezetimibe (ZETIA) 10 MG tablet   aspirin EC 81 MG tablet   Other Relevant Orders   Lipid panel   Hepatic function panel   Essential hypertension (Chronic)    Is not hypertensive.  Not on  medications.      Relevant Medications   ezetimibe (ZETIA) 10 MG tablet   aspirin EC 81 MG tablet   Obesity (BMI 30-39.9) (Chronic)   Presence of drug coated stent in right coronary artery, 02/27/13 (Chronic)   PVCs (premature ventricular contractions) (Chronic)    Pretty much asymptomatic.  His EKG today again shows right bundle branch block complete.  His PVCs before been right bundle-branch pattern. Relatively normal echo, I would be reluctant to treat unless symptomatic. He has not tolerated beta-blockers in the past.  If he becomes more symptomatic would at least try low-dose potentially bisoprolol.      Relevant Medications   ezetimibe (ZETIA) 10 MG tablet   aspirin EC 81 MG tablet   Other Relevant Orders   EKG 12-Lead (Completed)   Smoker (Chronic)    I think he is finally at the stage now where he should be able to quit.  Is down to 1 cigarette a week.  Should be okay to finally make that last.  I encouraged him to keep going and stay strong.         Current medicines are reviewed at length with the patient today. (+/- concerns) n/a The following changes have been made:   Patient Instructions  Medication Instructions:    stop plavix   start ASPIRIN 81 MG ONE TABLET DAILY    DOUBLE DOSE OF KRILL OIL  DAILY   If you need a refill on your cardiac medications before your next appointment, please call your pharmacy.   Lab work: Lipid, hepatic IN 6 MONTH WILL MAIL LABSLIP AT THAT TIME  If you have labs (blood work) drawn today and your tests are completely normal, you will receive your results  only by: Marland Kitchen MyChart Message (if you have MyChart) OR . A paper copy in the mail If you have any lab test that is abnormal or we need to change your treatment, we will call you to review the results.  Testing/Procedures: Not needed  Follow-Up: At [facility], you and your health needs are our priority.  As part of our continuing mission to provide you with exceptional  heart care, we have created designated Provider Care Teams.  These Care Teams include your primary Cardiologist (physician) and Advanced Practice Providers (APPs -  Physician Assistants and Nurse Practitioners) who all work together to provide you with the care you need, when you need it. You will need a follow up appointment in 12 months JAN 2021.  Please call our office 2 months in advance to schedule this appointment.  You may see DR Ellyn Hack or one of the following Advanced Practice Providers on your designated Care Team:   Rosaria Ferries, PA-C . Jory Sims, DNP, ANP  Any Other Special Instructions Will Be Listed Below (If Applicable).    Studies Ordered:   Orders Placed This Encounter  Procedures  . Lipid panel  . Hepatic function panel  . EKG 12-Lead      Glenetta Hew, M.D., M.S. Interventional Cardiologist   Pager # [phone] Phone # [phone] [address]. [place], [place] [address]

## 2018-03-20 NOTE — Patient Instructions (Signed)
Medication Instructions:    stop plavix   start ASPIRIN 81 MG ONE TABLET DAILY    DOUBLE DOSE OF KRILL OIL  DAILY   If you need a refill on your cardiac medications before your next appointment, please call your pharmacy.   Lab work: Lipid, hepatic IN 6 MONTH WILL MAIL LABSLIP AT THAT TIME  If you have labs (blood work) drawn today and your tests are completely normal, you will receive your results only by: Marland Kitchen MyChart Message (if you have MyChart) OR . A paper copy in the mail If you have any lab test that is abnormal or we need to change your treatment, we will call you to review the results.  Testing/Procedures: Not needed  Follow-Up: At [facility], you and your health needs are our priority.  As part of our continuing mission to provide you with exceptional heart care, we have created designated Provider Care Teams.  These Care Teams include your primary Cardiologist (physician) and Advanced Practice Providers (APPs -  Physician Assistants and Nurse Practitioners) who all work together to provide you with the care you need, when you need it. You will need a follow up appointment in 12 months JAN 2021.  Please call our office 2 months in advance to schedule this appointment.  You may see DR Ellyn Hack or one of the following Advanced Practice Providers on your designated Care Team:   Rosaria Ferries, PA-C . Jory Sims, DNP, ANP  Any Other Special Instructions Will Be Listed Below (If Applicable).

## 2018-03-23 ENCOUNTER — Encounter: Payer: Self-pay | Admitting: Cardiology

## 2018-03-23 NOTE — Assessment & Plan Note (Signed)
Is not hypertensive.  Not on medications.

## 2018-03-23 NOTE — Assessment & Plan Note (Signed)
Pretty much asymptomatic.  His EKG today again shows right bundle branch block complete.  His PVCs before been right bundle-branch pattern. Relatively normal echo, I would be reluctant to treat unless symptomatic. He has not tolerated beta-blockers in the past.  If he becomes more symptomatic would at least try low-dose potentially bisoprolol.

## 2018-03-23 NOTE — Assessment & Plan Note (Signed)
I think he is finally at the stage now where he should be able to quit.  Is down to 1 cigarette a week.  Should be okay to finally make that last.  I encouraged him to keep going and stay strong.

## 2018-03-23 NOTE — Assessment & Plan Note (Signed)
Most recent lipids show relatively stable levels, but LDL is still 86.  He is tolerating Zetia, but really does not like taking medications. At this point he would like to try to adjust his diet again and get back into a good routine.  We will increase his krill oil to 2 tabs daily, and reassess in 5 to 6 months.  If not at goal at that time, then I think we need to readdress the concept of Repatha/Praluent.

## 2018-03-23 NOTE — Assessment & Plan Note (Signed)
Several years out from overlapping DES PCI to the RCA in the setting of STEMI.  Well-preserved EF.  No recurrent angina or heart failure symptoms.   At this point I think were probably okay stopping Plavix and reverting back to baby aspirin. His blood pressures well controlled without any medications. Has tried multiple statins and been intolerant, is only on Zetia.

## 2018-03-24 ENCOUNTER — Encounter (HOSPITAL_COMMUNITY): Payer: BLUE CROSS/BLUE SHIELD

## 2018-03-26 ENCOUNTER — Encounter (HOSPITAL_COMMUNITY): Payer: BLUE CROSS/BLUE SHIELD

## 2018-04-24 ENCOUNTER — Encounter (HOSPITAL_COMMUNITY): Payer: Self-pay

## 2018-04-24 NOTE — Therapy (Signed)
[place], [place], [address] Phone: [phone]   Fax:  [phone]  Patient Details  Name: Louis Bruce MRN: [id number] Date of Birth: 07-Aug-1960 Referring Provider:  No ref. provider found  Encounter Date: 04/24/2018  PHYSICAL THERAPY DISCHARGE SUMMARY  Visits from Start of Care: 3  Current functional level related to goals / functional outcomes: [name] was seen for evaluation on 02/12/2018 and participated in 2 treatment sessions following. He was educated on HEP and on benefit of physical therapy. On 03/06/2018 patient's wife called to cancel all of his appointments as he was traveling out of town for work and asked for him to be placed on hold because he would reschedule when he returned. He has not rescheduled visits and has not returned since last session on 02/26/18. He will be discharged from this episode of care.   Remaining deficits: See last treatment note. Below goals were unable to be re-assessed due to    PT Short Term Goals - 02/12/18 1809            PT SHORT TERM GOAL #1   Title  Patient will be independent with HEP, updated PRN, to reduce pain and improve mobility to return to prior active lifestyle with less pain.    Time  2    Period  Weeks    Status     Target Date  02/26/18        PT SHORT TERM GOAL #2   Title  Patient will Achieve 5/5 Lt LE strength throughout with MMT to demonstrate improved functional strength to indicate reduce radicular weakness.    Time  3    Period  Weeks    Status     Target Date  03/05/18        PT SHORT TERM GOAL #3   Title  Patient will no longer have pain with lumbar quadrants ROM testing to indicate improve positional tolerance and reduced Lt Lumbar facet irritation to reduce pain with transitional movements.     Time  3    Period  Weeks    Status            PT Long Term Goals - 02/12/18 1809            PT LONG TERM GOAL  #1   Title  Patient will report no pain with getting in and out of his car for the past week to indicate improv elumbar facet mobility and reduced pain during transitional movements to improve QOL and return to active lifestyle.    Time  6    Period  Weeks    Status      Target Date  03/26/18        PT LONG TERM GOAL #2   Title  Patient will report no pain exacerbation with chopping wood and doing yardwork to indicate improved lumbar mobility and tolerance to daily activities to return to prior function with reduced pain.     Time  6    Period  Weeks    Status         PT LONG TERM GOAL #3   Title  Patient will improve FOTO score to 29% limited or less to indicate improve self reported function and QOL related to decreased limitation with daily activities due to back pain.    Time  6    Period  Weeks    Status  Education / Equipment: Patient had been educated on initial HEP and on attendance policy for therapy.   Plan: Patient agrees to discharge.  Patient goals were not met. Patient is being discharged due to not returning since the last visit.  ?????      Kipp Brood, PT, DPT, [facility] Physical Therapist with [facility]  04/24/2018 9:24 AM    [facility] [address] [place], [place], [address] Phone: [phone]   Fax:  [phone]

## 2018-10-28 DIAGNOSIS — H2513 Age-related nuclear cataract, bilateral: Secondary | ICD-10-CM | POA: Diagnosis not present

## 2018-11-19 DIAGNOSIS — N529 Male erectile dysfunction, unspecified: Secondary | ICD-10-CM | POA: Diagnosis not present

## 2018-11-19 DIAGNOSIS — Z1389 Encounter for screening for other disorder: Secondary | ICD-10-CM | POA: Diagnosis not present

## 2018-11-19 DIAGNOSIS — E6609 Other obesity due to excess calories: Secondary | ICD-10-CM | POA: Diagnosis not present

## 2018-11-19 DIAGNOSIS — Z6830 Body mass index (BMI) 30.0-30.9, adult: Secondary | ICD-10-CM | POA: Diagnosis not present

## 2019-04-06 ENCOUNTER — Other Ambulatory Visit: Payer: Self-pay

## 2019-04-06 ENCOUNTER — Ambulatory Visit: Payer: BLUE CROSS/BLUE SHIELD | Attending: Family

## 2019-04-06 DIAGNOSIS — Z20822 Contact with and (suspected) exposure to covid-19: Secondary | ICD-10-CM

## 2019-04-07 LAB — NOVEL CORONAVIRUS, NAA: SARS-CoV-2, NAA: NOT DETECTED

## 2019-05-14 ENCOUNTER — Telehealth: Payer: Self-pay | Admitting: Cardiology

## 2019-05-14 DIAGNOSIS — Z79899 Other long term (current) drug therapy: Secondary | ICD-10-CM

## 2019-05-14 DIAGNOSIS — E785 Hyperlipidemia, unspecified: Secondary | ICD-10-CM

## 2019-05-14 DIAGNOSIS — I2510 Atherosclerotic heart disease of native coronary artery without angina pectoris: Secondary | ICD-10-CM

## 2019-05-14 NOTE — Telephone Encounter (Signed)
Left message on voicemail -- labs CMP,LIPID ordered and placed . information should be [facility]  System  Any question may call back  MAY DO FASTING  PRIOR T O APPOINTMENT.

## 2019-05-14 NOTE — Telephone Encounter (Signed)
Patient wife states she is requesting to have orders for lab work for the patient sent to [facility]  [address] [place], [place] [address].

## 2019-05-14 NOTE — Telephone Encounter (Signed)
Spoke with patient and patient's spouse. Patient would like lab orders placed before he comes for his follow up appointment. Patient requesting a cholesterol check and anything else recommended by Dr. Ellyn Hack. Patient would like a call once the lab orders have been placed. Will route to MD to see if he would like to order lab work.

## 2019-05-15 DIAGNOSIS — Z79899 Other long term (current) drug therapy: Secondary | ICD-10-CM | POA: Diagnosis not present

## 2019-05-15 DIAGNOSIS — Z9861 Coronary angioplasty status: Secondary | ICD-10-CM | POA: Diagnosis not present

## 2019-05-15 DIAGNOSIS — E785 Hyperlipidemia, unspecified: Secondary | ICD-10-CM | POA: Diagnosis not present

## 2019-05-15 DIAGNOSIS — I2510 Atherosclerotic heart disease of native coronary artery without angina pectoris: Secondary | ICD-10-CM | POA: Diagnosis not present

## 2019-05-16 LAB — COMPREHENSIVE METABOLIC PANEL
ALT: 35 IU/L (ref 0–44)
AST: 27 IU/L (ref 0–40)
Albumin/Globulin Ratio: 2.3 — ABNORMAL HIGH (ref 1.2–2.2)
Albumin: 4.9 g/dL (ref 3.8–4.9)
Alkaline Phosphatase: 94 IU/L (ref 39–117)
BUN/Creatinine Ratio: 19 (ref 9–20)
BUN: 17 mg/dL (ref 6–24)
Bilirubin Total: 0.5 mg/dL (ref 0.0–1.2)
CO2: 25 mmol/L (ref 20–29)
Calcium: 10.2 mg/dL (ref 8.7–10.2)
Chloride: 100 mmol/L (ref 96–106)
Creatinine, Ser: 0.9 mg/dL (ref 0.76–1.27)
GFR calc Af Amer: 108 mL/min/{1.73_m2} (ref >59)
GFR calc non Af Amer: 93 mL/min/{1.73_m2} (ref >59)
Globulin, Total: 2.1 g/dL (ref 1.5–4.5)
Glucose: 95 mg/dL (ref 65–99)
Potassium: 4.3 mmol/L (ref 3.5–5.2)
Sodium: 141 mmol/L (ref 134–144)
Total Protein: 7 g/dL (ref 6.0–8.5)

## 2019-05-22 ENCOUNTER — Ambulatory Visit (INDEPENDENT_AMBULATORY_CARE_PROVIDER_SITE_OTHER): Payer: BLUE CROSS/BLUE SHIELD | Admitting: Cardiology

## 2019-05-22 ENCOUNTER — Other Ambulatory Visit: Payer: Self-pay

## 2019-05-22 ENCOUNTER — Encounter: Payer: Self-pay | Admitting: Cardiology

## 2019-05-22 VITALS — BP 130/78 | HR 58 | Temp 97.3°F | Ht 70.0 in | Wt 222.0 lb

## 2019-05-22 DIAGNOSIS — Z9861 Coronary angioplasty status: Secondary | ICD-10-CM | POA: Diagnosis not present

## 2019-05-22 DIAGNOSIS — I2119 ST elevation (STEMI) myocardial infarction involving other coronary artery of inferior wall: Secondary | ICD-10-CM

## 2019-05-22 DIAGNOSIS — I10 Essential (primary) hypertension: Secondary | ICD-10-CM | POA: Diagnosis not present

## 2019-05-22 DIAGNOSIS — Z955 Presence of coronary angioplasty implant and graft: Secondary | ICD-10-CM

## 2019-05-22 DIAGNOSIS — I2510 Atherosclerotic heart disease of native coronary artery without angina pectoris: Secondary | ICD-10-CM

## 2019-05-22 DIAGNOSIS — E785 Hyperlipidemia, unspecified: Secondary | ICD-10-CM | POA: Diagnosis not present

## 2019-05-22 DIAGNOSIS — R001 Bradycardia, unspecified: Secondary | ICD-10-CM

## 2019-05-22 NOTE — Patient Instructions (Signed)
Medication Instructions:  NO CHANGES *If you need a refill on your cardiac medications before your next appointment, please call your pharmacy*   Lab Work: NOT NEEDED   Testing/Procedures: NOT NEEDED   Follow-Up: At [facility], you and your health needs are our priority.  As part of our continuing mission to provide you with exceptional heart care, we have created designated Provider Care Teams.  These Care Teams include your primary Cardiologist (physician) and Advanced Practice Providers (APPs -  Physician Assistants and Nurse Practitioners) who all work together to provide you with the care you need, when you need it.  We recommend signing up for the patient portal called "MyChart".  Sign up information is provided on this After Visit Summary.  MyChart is used to connect with patients for Virtual Visits (Telemedicine).  Patients are able to view lab/test results, encounter notes, upcoming appointments, etc.  Non-urgent messages can be sent to your provider as well.   To learn more about what you can do with MyChart, go to [email or web address].    Your next appointment:   12 month(s)  The format for your next appointment:   In Person  Provider:   Glenetta Hew, MD   Other Instructions

## 2019-05-22 NOTE — Progress Notes (Signed)
Primary Care Provider: Redmond School, MD Cardiologist: Glenetta Hew, MD Electrophysiologist: None  Clinic Note: Chief Complaint  Patient presents with  . Follow-up    12 months.  . Coronary Artery Disease    No chest pain   HPI:    Louis Bruce is a 59 y.o. male with a PMH below who presents today for delayed annual follow-up for CAD.   S/p Inferior STEMI in December 2014 : occluded RCA treated with DES stent.   Mostly preserved EF of 50% by echo.   Originally on Brilinta converted to Effient for dyspnea and fatigue --> then converted to Plavix  Carvedilol dose has been reduced on several occasions due to dizziness and fatigue.  Has had issues with statins. Did not tolerate Crestor, Lipitor or simvastatin. Was on fish oil. --> scheduled to see Tommy Medal, [place] (in [facility] - Cardiovascular [facility] Reduction clinic)  to discuss the management, but did not make it -- now on Zetia (very reluctant to take any medications)  Louis Bruce was last seen back in January 2020.  As usual, he complained about being on a medication.  He was very happy to be off the beta-blocker.  Had no angina or heart failure symptoms.  Was working on losing weight and has been increasing his exercise level as well as changing diet.  He indicated that he really was not insulin taking any additional medications for his lipids, in fact wants to be off of Zetia.  Recent Hospitalizations: None  Reviewed  CV studies:    The following studies were reviewed today: (if available, images/films reviewed: From Epic Chart or Care Everywhere) . None since his echo in 2017.  Interval History:   Louis Bruce returns here today doing well.  He is seems to be in a rush to get out of here.  He wants to ask questions about when he can get off medications.  Does not like taking anything.  He says that he is doing well, very active.Marland Kitchen  He does admit that he is trying to kick the cigarettes, but is down to maybe 1/4  pack a day, but unable to fully quit.  He says he has gotten quite tired of working from home.  He says is been almost a year since has been able to go into the office.  Started missing any personal contact with people.  He is very serious about wanting to come off of any counter medications, he does not want to take Zetia and is wondering what he can do to get his lipids down.  He says he is willing to adjust his diet.  He barely even likes taking the aspirin.  Pretty much asymptomatic from cardiac standpoint.  He is very active with his walking routinely.  He says though with working at home he is gained weight, not taking the time to walk and not doing as well with his eating.  CV Review of Symptoms (Summary): no chest pain or dyspnea on exertion positive for - Weight gain due to being more sedentary and probably not eating as well as he should. negative for - edema, irregular heartbeat, orthopnea, palpitations, paroxysmal nocturnal dyspnea, rapid heart rate, shortness of breath or Syncope/near syncope, TIA/amaurosis fugax, claudication  The patient does not have symptoms concerning for COVID-19 infection (fever, chills, cough, or new shortness of breath).  The patient is practicing social distancing & Masking.    REVIEWED OF SYSTEMS   Review of Systems  Constitutional: Negative  for malaise/fatigue and weight loss.  HENT: Negative for congestion.   Respiratory: Negative for shortness of breath.   Gastrointestinal: Negative for blood in stool and melena.  Musculoskeletal: Negative for falls and joint pain.  Neurological: Negative for headaches.  Psychiatric/Behavioral: Negative.    I have reviewed and (if needed) personally updated the patient's problem list, medications, allergies, past medical and surgical history, social and family history.   PAST MEDICAL HISTORY   Past Medical History:  Diagnosis Date  . Anxiety   . CAD S/P percutaneous coronary angioplasty 02/2013   STEMI of  inf. wall -- DES x 2 RCA (Xience Alpine DES 3.5 mmx 18 & 8 mm overlapping) and residual LAD disease ~50% on relook cath  . Dyslipidemia, goal LDL below 70   . Essential hypertension   . Former heavy tobacco smoker    Quit in December 2014  . Hypertriglyceridemia   . PVCs (premature ventricular contractions) 08/16/2015  . ST elevation myocardial infarction (STEMI) of inferior wall ([place]) 02/2013   treated with stent to RCA; Echo 08/2015 - low Normal EF 50-55%, no RWMA.    PAST SURGICAL HISTORY   Past Surgical History:  Procedure Laterality Date  . APPENDECTOMY    . COLONOSCOPY N/A 10/26/2016   Procedure: COLONOSCOPY;  Surgeon: Daneil Dolin, MD;  [facility];  Service: Endoscopy;  Laterality: N/A;  8:30 AM  . COLONOSCOPY N/A 01/18/2017   Procedure: COLONOSCOPY;  Surgeon: Daneil Dolin, MD;  [facility];  Service: Endoscopy;  Laterality: N/A;  2:45pm  . CORONARY ANGIOPLASTY WITH STENT PLACEMENT  02/2013   Inferior STEMI- dRCA PCI Xience Alpine DES 3.5 mm x 18 mm & 3.5 mm x 8 mm overlapping DES; residual ~50-60% mLAD  . DOPPLER ECHOCARDIOGRAPHY  12/20/2006   EF normal  . FRACTIONAL FLOW RESERVE WIRE N/A 03/02/2013   Procedure: FRACTIONAL FLOW RESERVE WIRE;  Surgeon: Lorretta Harp, MD;  Location: [facility] CATH LAB;  Service: Cardiovascular;  Laterality: N/A;  . INGUINAL HERNIA REPAIR    . LEFT HEART CATHETERIZATION WITH CORONARY ANGIOGRAM N/A 02/27/2013   Procedure: LEFT HEART CATHETERIZATION WITH CORONARY ANGIOGRAM;  Surgeon: Leonie Man, MD;  Location: [facility] CATH LAB;  Service: Cardiovascular;  Laterality: N/A;  . NM MYOVIEW LTD  12/20/2006   EF 64%  . PERCUTANEOUS CORONARY STENT INTERVENTION (PCI-S)  02/27/2013   Procedure: PERCUTANEOUS CORONARY STENT INTERVENTION (PCI-S);  Surgeon: Leonie Man, MD;  Location: [facility] CATH LAB;  Service: Cardiovascular;;  . POLYPECTOMY  10/26/2016   Procedure: POLYPECTOMY;  Surgeon: Daneil Dolin, MD;  [facility];   Service: Endoscopy;;  colon  . POLYPECTOMY  01/18/2017   Procedure: POLYPECTOMY;  Surgeon: Daneil Dolin, MD;  [facility];  Service: Endoscopy;;  transverse colon x5  . TONSILLECTOMY    . TRANSTHORACIC ECHOCARDIOGRAM  03/16/2013; 08/2015   a). EF roughly 50%. Otherwise relatively normal; b). normal LV size and thickness. Low normal EF 50-55%. No RWMA. Mild Ao Sclerosis.    MEDICATIONS/ALLERGIES   Current Meds  Medication Sig  . aspirin EC 81 MG tablet Take 1 tablet (81 mg total) by mouth daily.  Marland Kitchen ezetimibe (ZETIA) 10 MG tablet Take 1 tablet (10 mg total) by mouth daily.  Javier Docker Oil 300 MG CAPS Take 2 capsules (600 mg total) by mouth daily.  . Multiple Vitamin (MULTIVITAMIN WITH MINERALS) TABS tablet Take 1 tablet by mouth daily.  . sildenafil (REVATIO) 20 MG tablet Take 20  mg by mouth as needed.    Allergies  Allergen Reactions  . Statins Other (See Comments)    myalgia    SOCIAL HISTORY/FAMILY HISTORY   Reviewed in Epic:  Pertinent findings: working from home for > 1 yr.  Not as much activity / exercise.  Gained wgt.    OBJCTIVE -PE, EKG, labs   Wt Readings from Last 3 Encounters:  05/22/19 222 lb (100.7 kg)  03/20/18 200 lb (90.7 kg)  01/06/18 199 lb (90.3 kg)    Physical Exam: BP 130/78 (BP Location: Left Arm, Patient Position: Sitting, Cuff Size: Normal)   Pulse (!) 58   Temp (!) 97.3 F (36.3 C)   Ht 5\' 10"  (1.778 m)   Wt 222 lb (100.7 kg)   BMI 31.85 kg/m  Physical Exam  Constitutional: He is oriented to person, place, and time. He appears well-developed and well-nourished. No distress.  Healthy-appearing.  Does seem to be notably heavier.  HENT:  Head: Normocephalic and atraumatic.  Neck: No JVD present. Carotid bruit is not present.  Cardiovascular: Regular rhythm and S1 normal.  Occasional extrasystoles are present. Bradycardia present. PMI is not displaced. Exam reveals decreased pulses (Mildly diminished pedal pulses). Exam reveals no  gallop and no friction rub.  No murmur heard. Physiologically split S2  Pulmonary/Chest: Effort normal and breath sounds normal. No respiratory distress. He has no wheezes. He has no rales.  Abdominal: Soft. Bowel sounds are normal. He exhibits no distension. There is no abdominal tenderness. There is no rebound.  Musculoskeletal:        General: No edema (Trivial). Normal range of motion.     Cervical back: Normal range of motion and neck supple.  Neurological: He is alert and oriented to person, place, and time.  Psychiatric: He has a normal mood and affect. His behavior is normal. Judgment and thought content normal.  Vitals reviewed.   Adult ECG Report  Rate: 58 ;  Rhythm: sinus bradycardia, premature ventricular contractions (PVC) and RBBB.  Inferior MI, age undetermined.;   Narrative Interpretation: Stable  Recent Labs: Due for labs-apparently is fasting lipids were not checked when he went to get labs earlier this month. Lab Results  Component Value Date   CHOL 167 05/26/2019   HDL 41 05/26/2019   LDLCALC 103 (H) 05/26/2019   TRIG 128 05/26/2019   CHOLHDL 4.1 05/26/2019    Lab Results  Component Value Date   CREATININE 0.90 05/15/2019   BUN 17 05/15/2019   NA 141 05/15/2019   K 4.3 05/15/2019   CL 100 05/15/2019   CO2 25 05/15/2019   Lab Results  Component Value Date   TSH 2.052 08/16/2015    ASSESSMENT/PLAN    Problem List Items Addressed This Visit    CAD S/P percutaneous coronary angioplasty - Primary (Chronic)    Distant history of PCI with overlapping stents in the RCA.  Pretty large diameter stents.  He got off the antiplatelet agent as quickly as he could.  Stopped it on his own.  Is now on aspirin.  I convinced her that he needs to stay on aspirin despite having some bleeding or bruising issues. Pretty much has refused medicines including beta-blockers and ARB/ACE inhibitor.-His blood pressures are pretty well controlled, and he argues that he "does not  need them ".  He has had complaints with just about anything restarted. Not interested in statins, wants to be off of Zetia.  As such, not able to do GDMT.  He understands  that he is not on goal-directed therapy and is willing to take the risk in order to avoid being on medications.      Relevant Orders   EKG 12-Lead (Completed)   Presence of drug coated stent in right coronary artery, 02/27/13 (Chronic)    He took Effient for a year, we switched him to Plavix and he just simply stopped.  Never continued at next follow-up.  He is far enough out now her aspirin alone is probably okay.      Dyslipidemia, goal LDL below 70 (Chronic)    Unfortunately he did not get his lipids checked, this was probably an error on the part of the lab tech.  Labs were ordered.  He will simply go back to get them checked sometime in the next few weeks.  I informed her that I think he probably will end up staying on the Zetia because this is the only thing he is taking.  He does not like taking medications but I did not really give an option.      STEMI of inferior wall 02/27/13- RCA DES X 2 (Chronic)    No residual angina or heart failure symptoms.  Preserved EF on echo.      Relevant Orders   EKG 12-Lead (Completed)   Essential hypertension (Chronic)    Borderline class I hypertension on no meds with blood pressure 130/78.  With his reluctance to take medications, he was 90 minutes sitting talking about ACE inhibitor/ARB or beta-blocker.  He is bradycardic at baseline, therefore I would not use beta-blocker.      Bradycardia (Chronic)       COVID-19 Education: The signs and symptoms of COVID-19 were discussed with the patient and how to seek care for testing (follow up with PCP or arrange E-visit).   The importance of social distancing was discussed today.  I spent a total of 15minutes with the patient. >  50% of the time was spent in direct patient consultation.  Additional time spent with chart  review  / charting (studies, outside notes, etc): 6 Total Time: 27 min   Current medicines are reviewed at length with the patient today.  (+/- concerns) months, off meds   Patient Instructions / Medication Changes & Studies & Tests Ordered   Patient Instructions  Medication Instructions:  NO CHANGES *If you need a refill on your cardiac medications before your next appointment, please call your pharmacy*   Lab Work: NOT NEEDED   Testing/Procedures: NOT NEEDED   Follow-Up: At [facility], you and your health needs are our priority.  As part of our continuing mission to provide you with exceptional heart care, we have created designated Provider Care Teams.  These Care Teams include your primary Cardiologist (physician) and Advanced Practice Providers (APPs -  Physician Assistants and Nurse Practitioners) who all work together to provide you with the care you need, when you need it.  We recommend signing up for the patient portal called "MyChart".  Sign up information is provided on this After Visit Summary.  MyChart is used to connect with patients for Virtual Visits (Telemedicine).  Patients are able to view lab/test results, encounter notes, upcoming appointments, etc.  Non-urgent messages can be sent to your provider as well.   To learn more about what you can do with MyChart, go to [email or web address].    Your next appointment:   12 month(s)  The format for your next appointment:   In Person  Provider:   Shanon Brow  Ellyn Hack, MD   Other Instructions     Studies Ordered:   Orders Placed This Encounter  Procedures  . EKG 12-Lead     Glenetta Hew, M.D., M.S. Interventional Cardiologist   Pager # [phone] Phone # [phone] [address] [place], [place] [address]   Thank you for choosing Heartcare at [facility]!!

## 2019-05-26 DIAGNOSIS — I2510 Atherosclerotic heart disease of native coronary artery without angina pectoris: Secondary | ICD-10-CM | POA: Diagnosis not present

## 2019-05-26 DIAGNOSIS — Z9861 Coronary angioplasty status: Secondary | ICD-10-CM | POA: Diagnosis not present

## 2019-05-26 DIAGNOSIS — Z79899 Other long term (current) drug therapy: Secondary | ICD-10-CM | POA: Diagnosis not present

## 2019-05-26 DIAGNOSIS — E785 Hyperlipidemia, unspecified: Secondary | ICD-10-CM | POA: Diagnosis not present

## 2019-05-27 ENCOUNTER — Encounter: Payer: Self-pay | Admitting: Cardiology

## 2019-05-27 LAB — LIPID PANEL
Chol/HDL Ratio: 4.1 ratio (ref 0.0–5.0)
Cholesterol, Total: 167 mg/dL (ref 100–199)
HDL: 41 mg/dL (ref >39)
LDL Chol Calc (NIH): 103 mg/dL — ABNORMAL HIGH (ref 0–99)
Triglycerides: 128 mg/dL (ref 0–149)
VLDL Cholesterol Cal: 23 mg/dL (ref 5–40)

## 2019-05-27 NOTE — Assessment & Plan Note (Signed)
No residual angina or heart failure symptoms.  Preserved EF on echo.

## 2019-05-27 NOTE — Assessment & Plan Note (Signed)
Borderline class I hypertension on no meds with blood pressure 130/78.  With his reluctance to take medications, he was 90 minutes sitting talking about ACE inhibitor/ARB or beta-blocker.  He is bradycardic at baseline, therefore I would not use beta-blocker.

## 2019-05-27 NOTE — Assessment & Plan Note (Signed)
He took Effient for a year, we switched him to Plavix and he just simply stopped.  Never continued at next follow-up.  He is far enough out now her aspirin alone is probably okay.

## 2019-05-27 NOTE — Assessment & Plan Note (Signed)
Unfortunately he did not get his lipids checked, this was probably an error on the part of the lab tech.  Labs were ordered.  He will simply go back to get them checked sometime in the next few weeks.  I informed her that I think he probably will end up staying on the Zetia because this is the only thing he is taking.  He does not like taking medications but I did not really give an option.

## 2019-05-27 NOTE — Assessment & Plan Note (Signed)
Distant history of PCI with overlapping stents in the RCA.  Pretty large diameter stents.  He got off the antiplatelet agent as quickly as he could.  Stopped it on his own.  Is now on aspirin.  I convinced her that he needs to stay on aspirin despite having some bleeding or bruising issues. Pretty much has refused medicines including beta-blockers and ARB/ACE inhibitor.-His blood pressures are pretty well controlled, and he argues that he "does not need them ".  He has had complaints with just about anything restarted. Not interested in statins, wants to be off of Zetia.  As such, not able to do GDMT.  He understands that he is not on goal-directed therapy and is willing to take the risk in order to avoid being on medications.

## 2019-08-15 ENCOUNTER — Other Ambulatory Visit: Payer: Self-pay | Admitting: Cardiology

## 2019-08-24 ENCOUNTER — Telehealth: Payer: Self-pay | Admitting: Cardiology

## 2019-08-24 MED ORDER — EZETIMIBE 10 MG PO TABS: 10.0000 mg | ORAL_TABLET | Freq: Every day | ORAL | 3 refills | Status: DC

## 2019-08-24 NOTE — Telephone Encounter (Signed)
Spoke to wife . Patient is to continue Zetia 10 mg daily  prescription e- sent  To pharmacy #90 day  X 3 refills

## 2019-08-24 NOTE — Telephone Encounter (Signed)
New Message  Pt c/o medication issue:  1. Name of Medication: ezetimibe (ZETIA) 10 MG tablet  2. How are you currently taking this medication (dosage and times per day)? As directed  3. Are you having a reaction (difficulty breathing--STAT)? No  4. What is your medication issue? Patient is wanting to know if he needs to continue taking medication. States that if he does, he will need a refill on it. Please advise.

## 2019-09-03 ENCOUNTER — Other Ambulatory Visit: Payer: Self-pay | Admitting: Cardiology

## 2019-09-03 MED ORDER — EZETIMIBE 10 MG PO TABS: 10.0000 mg | ORAL_TABLET | Freq: Every day | ORAL | 3 refills | Status: DC

## 2019-09-03 NOTE — Telephone Encounter (Signed)
*  STAT* If patient is at the pharmacy, call can be transferred to refill team.   1. Which medications need to be refilled? (please list name of each medication and dose if known) ezetimibe (ZETIA) 10 MG tablet  2. Which pharmacy/location (including street and city if local pharmacy) is medication to be sent to? [place], [place]  3. Do they need a 30 day or 90 day supply? 90   Patient also needs prior authorization on medication.

## 2019-09-03 NOTE — Telephone Encounter (Signed)
Follow Up:   Pt's Zetia was sent to the wrong pharmacy. It needs to go [facility]

## 2019-09-18 ENCOUNTER — Telehealth: Payer: Self-pay | Admitting: Cardiology

## 2019-09-18 MED ORDER — EZETIMIBE 10 MG PO TABS: 10.0000 mg | ORAL_TABLET | Freq: Every day | ORAL | 3 refills | Status: DC

## 2019-09-18 NOTE — Telephone Encounter (Signed)
Patient's wife called, I advised her a prior-auth was obtained.

## 2019-09-18 NOTE — Telephone Encounter (Signed)
Pt c/o medication issue:  1. Name of Medication: ezetimibe (ZETIA) 10 MG tablet  2. How are you currently taking this medication (dosage and times per day)? 1 tablet daily  3. Are you having a reaction (difficulty breathing--STAT)? no  4. What is your medication issue? Patient's wife states their pharmacy [facility] is requiring a prior authorization for the medication. She states it was accidentally sent to [facility] and that he is almost out of the medication.

## 2019-09-18 NOTE — Telephone Encounter (Signed)
Unable to get call to go through to pharmacy not sure if it is due to calling app .Adonis Housekeeper

## 2019-09-18 NOTE — Telephone Encounter (Signed)
Prior Josem Kaufmann has been done and medication has been approved Ref # [id number]

## 2020-01-25 ENCOUNTER — Encounter: Payer: Self-pay | Admitting: Internal Medicine

## 2020-03-23 DIAGNOSIS — N529 Male erectile dysfunction, unspecified: Secondary | ICD-10-CM | POA: Diagnosis not present

## 2020-03-23 DIAGNOSIS — Z6833 Body mass index (BMI) 33.0-33.9, adult: Secondary | ICD-10-CM | POA: Diagnosis not present

## 2020-03-23 DIAGNOSIS — Z1331 Encounter for screening for depression: Secondary | ICD-10-CM | POA: Diagnosis not present

## 2020-03-23 DIAGNOSIS — R0789 Other chest pain: Secondary | ICD-10-CM | POA: Diagnosis not present

## 2020-03-23 DIAGNOSIS — I10 Essential (primary) hypertension: Secondary | ICD-10-CM | POA: Diagnosis not present

## 2020-03-23 DIAGNOSIS — M546 Pain in thoracic spine: Secondary | ICD-10-CM | POA: Diagnosis not present

## 2020-08-22 ENCOUNTER — Other Ambulatory Visit: Payer: Self-pay

## 2020-09-05 ENCOUNTER — Ambulatory Visit (INDEPENDENT_AMBULATORY_CARE_PROVIDER_SITE_OTHER): Payer: BLUE CROSS/BLUE SHIELD | Admitting: Cardiology

## 2020-09-05 ENCOUNTER — Encounter: Payer: Self-pay | Admitting: Cardiology

## 2020-09-05 ENCOUNTER — Other Ambulatory Visit: Payer: Self-pay

## 2020-09-05 VITALS — BP 122/78 | HR 59 | Ht 69.0 in | Wt 218.8 lb

## 2020-09-05 DIAGNOSIS — I10 Essential (primary) hypertension: Secondary | ICD-10-CM | POA: Diagnosis not present

## 2020-09-05 DIAGNOSIS — Z9861 Coronary angioplasty status: Secondary | ICD-10-CM

## 2020-09-05 DIAGNOSIS — E785 Hyperlipidemia, unspecified: Secondary | ICD-10-CM | POA: Diagnosis not present

## 2020-09-05 DIAGNOSIS — Z955 Presence of coronary angioplasty implant and graft: Secondary | ICD-10-CM

## 2020-09-05 DIAGNOSIS — R001 Bradycardia, unspecified: Secondary | ICD-10-CM

## 2020-09-05 DIAGNOSIS — T466X5A Adverse effect of antihyperlipidemic and antiarteriosclerotic drugs, initial encounter: Secondary | ICD-10-CM

## 2020-09-05 DIAGNOSIS — F17200 Nicotine dependence, unspecified, uncomplicated: Secondary | ICD-10-CM

## 2020-09-05 DIAGNOSIS — I2119 ST elevation (STEMI) myocardial infarction involving other coronary artery of inferior wall: Secondary | ICD-10-CM

## 2020-09-05 DIAGNOSIS — I2510 Atherosclerotic heart disease of native coronary artery without angina pectoris: Secondary | ICD-10-CM

## 2020-09-05 DIAGNOSIS — E669 Obesity, unspecified: Secondary | ICD-10-CM

## 2020-09-05 DIAGNOSIS — M791 Myalgia, unspecified site: Secondary | ICD-10-CM

## 2020-09-05 NOTE — Progress Notes (Signed)
Primary Care Provider: Redmond School, MD Cardiologist: Glenetta Hew, MD Electrophysiologist: None  Clinic Note: Chief Complaint  Patient presents with   Follow-up    Delayed annual    ===================================  ASSESSMENT/PLAN   Problem List Items Addressed This Visit     CAD S/P percutaneous coronary angioplasty - Primary (Chronic)    Distant history of PCI of the RCA with 2 overlapping stents.  As much I would like for him to be on maintenance dose Thienopyridine, he insisted on stopping because of bleeding or bruising issues.  He was agreeable to take aspirin, but gradually.  Difficult to manage because of his unwillingness to take medications.  Intolerant of statins because of myalgias and memory issues.  Intolerant of beta-blocker because of bradycardia and fatigue.  Unwilling to try ACE inhibitor or ARB-but now his blood pressure Artley stable.  Plan: For now continue aspirin and Zetia.  Potentially converting to Nexlizet       Relevant Orders   EKG 12-Lead (Completed)   Lipid panel (Completed)   Comprehensive metabolic panel (Completed)   Presence of drug coated stent in right coronary artery, 02/27/13 (Chronic)    He took a Thienopyridine for couple years, but no longer willing to take anything besides aspirin.  He has tried stopping aspirin which I told him was not acceptable.       Dyslipidemia, goal LDL below 70 (Chronic)    He needs lipids checked.  We will order lipid panel and CMP.  He is on Zetia, and may be perhaps we can get upgrade to Nexletol if lipids are not at goal.  I do not think eventually doing any therefore I do not think inclisiranfor PCSK9 inhibitors would be viable options.  He is not going to take a statin.       Relevant Orders   Lipid panel (Completed)   Comprehensive metabolic panel (Completed)   Myalgia due to statin (Chronic)    He has tried several different statins, at this point he will not take another 1.  We  talked about in detail.  Currently taken Zetia and very happily.  He also takes fish oil with questionable benefit.  Plan: Recheck lipid panel-potentially consider adding Nexletol in the form of Nexlizet       Obesity (BMI 30-39.9) (Chronic)    Slowly trying to lose weight, but has plateaued.  He is very active, and we discussed importance of adjusting his diet, especially if he does not want take a cholesterol-lowering medication.       STEMI of inferior wall 02/27/13- RCA DES X 2 (Chronic)    7-1/2 years out from his MI.  Doing well with no recurrent angina or heart failure symptoms.  EF pretty much preserved on echo.  He is very reluctant to take any medications and therefore difficult to manage. He is on aspirin and ezetimibe along with Krill oil.  Very reluctant to take any other medications.       Relevant Orders   EKG 12-Lead (Completed)   Lipid panel (Completed)   Comprehensive metabolic panel (Completed)   Essential hypertension (Chronic)    Not really true diagnosis.  More borderline.  Current blood pressure is 122/78 which per him was in the normal range.  He was not tolerant of beta-blocker because of bradycardia and fatigue.  He does not want to take any medication for blood pressure.  Given that he has no cardiac symptoms today,, hard to argue.  Relevant Orders   Comprehensive metabolic panel (Completed)   Smoker (Chronic)   Relevant Orders   Comprehensive metabolic panel (Completed)   Bradycardia (Chronic)    Unable to tolerate beta-blocker or 9 dihydropyridine calcium underwater.  Avoid AV nodal agents.  Became quite fatigued while on medications.       Relevant Orders   EKG 12-Lead (Completed)   Lipid panel (Completed)   Comprehensive metabolic panel (Completed)   ===================================  HPI:    Louis Bruce is a 60 y.o. male with a PMH notable for CAD, & HLD who presents today for delayed annual follow-up.  Pertinent Cardiac  History:  Inferior STEMI 02/2013: 100% RCA - DES PCI, EF ~50%. On maintenance ASA only  Unable to tolerate Beta Blocker 2/2 fatigue.  HLD - Statin Intolerance (Myalgia/Memory Loss -atorvastatin, simvastatin, rosuvastatin) now only on ezetimibe and Krill oil.  Louis Bruce was last seen in March 2021 -> was doing well.  Mostly asked about when he can get off of medications.  Does not like to take any medications including aspirin and Zetia..  Stated that he was very active and doing well.  Working" quit quitting cigarettes-down to 1/4 PPD.  Unable to quit.  Tired of working from home.  Missing personal contact.  Also tired of traveling from work. --> Stable from a cardiac standpoint.  Walking routinely, but gaining weight work from home.  Not eating as well as he should.  Recent Hospitalizations: None  Reviewed  CV studies:    The following studies were reviewed today: (if available, images/films reviewed: From Epic Chart or Care Everywhere) None:   Interval History:   Louis Bruce returns here today again doing relatively well.  He is very active.  He walks around the house does gardening and yard work.  He also enjoys hunting and fishing.  He does not do routine exercise.  He does not like to go outside in the heat, and that about the only time he notes a little bit of dizziness or fatigue.  He is still trying to adjust his diet and try to cut out certain foods.  Eating more vegetables from his garden, trying to cut out processed foods.  Pretty much asymptomatic from a cardiac standpoint.  CV Review of Symptoms (Summary): no chest pain or dyspnea on exertion negative for - edema, irregular heartbeat, orthopnea, palpitations, paroxysmal nocturnal dyspnea, rapid heart rate, shortness of breath, or lightheadedness, dizziness or wooziness, syncope/near syncope or TIA/amaurosis fugax, claudication  REVIEWED OF SYSTEMS   Review of Systems  Constitutional:  Negative for malaise/fatigue  and weight loss (Minimal weight loss since last year).  HENT:  Negative for nosebleeds.   Respiratory:  Negative for cough and shortness of breath.   Cardiovascular:        Per HPI  Gastrointestinal:  Negative for blood in stool and melena.  Genitourinary:  Negative for hematuria.  Musculoskeletal:  Positive for joint pain. Negative for falls.  Neurological:  Negative for dizziness, focal weakness and weakness.  Psychiatric/Behavioral: Negative.     I have reviewed and (if needed) personally updated the patient's problem list, medications, allergies, past medical and surgical history, social and family history.   PAST MEDICAL HISTORY   Past Medical History:  Diagnosis Date   Anxiety    CAD S/P percutaneous coronary angioplasty 02/2013   STEMI of inf. wall -- DES x 2 RCA (Xience Alpine DES 3.5 mmx 18 & 8 mm overlapping) and residual LAD disease ~50% on  relook cath   Dyslipidemia, goal LDL below 70    Essential hypertension    Former heavy tobacco smoker    Quit in December 2014   Hypertriglyceridemia    PVCs (premature ventricular contractions) 08/16/2015   ST elevation myocardial infarction (STEMI) of inferior wall ([place]) 02/2013   treated with stent to RCA; Echo 08/2015 - low Normal EF 50-55%, no RWMA.    PAST SURGICAL HISTORY   Past Surgical History:  Procedure Laterality Date   APPENDECTOMY     COLONOSCOPY N/A 10/26/2016   Procedure: COLONOSCOPY;  Surgeon: Daneil Dolin, MD;  [facility];  Service: Endoscopy;  Laterality: N/A;  8:30 AM   COLONOSCOPY N/A 01/18/2017   Procedure: COLONOSCOPY;  Surgeon: Daneil Dolin, MD;  [facility];  Service: Endoscopy;  Laterality: N/A;  2:45pm   CORONARY ANGIOPLASTY WITH STENT PLACEMENT  02/2013   Inferior STEMI- dRCA PCI Xience Alpine DES 3.5 mm x 18 mm & 3.5 mm x 8 mm overlapping DES; residual ~50-60% mLAD   DOPPLER ECHOCARDIOGRAPHY  12/20/2006   EF normal   FRACTIONAL FLOW RESERVE WIRE N/A 03/02/2013    Procedure: FRACTIONAL FLOW RESERVE WIRE;  Surgeon: Lorretta Harp, MD;  Location: [facility] CATH LAB;  Service: Cardiovascular;  Laterality: N/A;   INGUINAL HERNIA REPAIR     LEFT HEART CATHETERIZATION WITH CORONARY ANGIOGRAM N/A 02/27/2013   Procedure: LEFT HEART CATHETERIZATION WITH CORONARY ANGIOGRAM;  Surgeon: Leonie Man, MD;  Location: [facility] CATH LAB;  Service: Cardiovascular;  Laterality: N/A;   NM MYOVIEW LTD  12/20/2006   EF 64%   PERCUTANEOUS CORONARY STENT INTERVENTION (PCI-S)  02/27/2013   Procedure: PERCUTANEOUS CORONARY STENT INTERVENTION (PCI-S);  Surgeon: Leonie Man, MD;  Location: [facility] CATH LAB;  Service: Cardiovascular;;   POLYPECTOMY  10/26/2016   Procedure: POLYPECTOMY;  Surgeon: Daneil Dolin, MD;  [facility];  Service: Endoscopy;;  colon   POLYPECTOMY  01/18/2017   Procedure: POLYPECTOMY;  Surgeon: Daneil Dolin, MD;  [facility];  Service: Endoscopy;;  transverse colon x5   TONSILLECTOMY     TRANSTHORACIC ECHOCARDIOGRAM  03/16/2013; 08/2015   a). EF roughly 50%. Otherwise relatively normal; b). normal LV size and thickness. Low normal EF 50-55%. No RWMA. Mild Ao Sclerosis.    Immunization History  Administered Date(s) Administered   Unspecified SARS-COV-2 Vaccination 08/24/2019    MEDICATIONS/ALLERGIES   Current Meds  Medication Sig   aspirin EC 81 MG tablet Take 1 tablet (81 mg total) by mouth daily.   ezetimibe (ZETIA) 10 MG tablet Take 1 tablet (10 mg total) by mouth daily.   Krill Oil 300 MG CAPS Take 2 capsules (600 mg total) by mouth daily.   Multiple Vitamin (MULTIVITAMIN WITH MINERALS) TABS tablet Take 1 tablet by mouth daily.    Allergies  Allergen Reactions   Statins Other (See Comments)    myalgia    SOCIAL HISTORY/FAMILY HISTORY   Reviewed in Epic:  Pertinent findings:  Social History   Tobacco Use   Smoking status: Light Smoker    Packs/day: 1.00    Years: 35.00    Pack years: 35.00    Types: Cigarettes     Start date: 05/31/2014   Smokeless tobacco: Never  Vaping Use   Vaping Use: Former  Substance Use Topics   Alcohol use: No    Alcohol/week: 10.0 standard drinks    Types: 10 Shots of liquor per week   Drug use: No  Social History   Social History Narrative   He is married. 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  Down to 1 cig/week (Jan 2020)      He does have occasional shots of liquor.      He is not as active as he would like to be. Most of the time as his arthritis that stops him.       OBJCTIVE -PE, EKG, labs   Wt Readings from Last 3 Encounters:  09/05/20 218 lb 12.8 oz (99.2 kg)  05/22/19 222 lb (100.7 kg)  03/20/18 200 lb (90.7 kg)    Physical Exam: BP 122/78 (BP Location: Left Arm, Patient Position: Sitting, Cuff Size: Normal)   Pulse (!) 59   Ht 5\' 9"  (1.753 m)   Wt 218 lb 12.8 oz (99.2 kg)   SpO2 98%   BMI 32.31 kg/m  Physical Exam Vitals reviewed.  Constitutional:      General: He is not in acute distress.    Appearance: Normal appearance. He is obese. He is not ill-appearing or toxic-appearing.     Comments: Well-nourished, well-groomed.  Healthy-appearing.  Neck:     Vascular: No carotid bruit, hepatojugular reflux or JVD.  Cardiovascular:     Rate and Rhythm: Normal rate and regular rhythm. No extrasystoles are present.    Chest Wall: PMI is not displaced.     Pulses: Normal pulses.     Heart sounds: Heart sounds are distant. No murmur heard.   No friction rub. No gallop.     Comments: Normal S1 with split S2. Pulmonary:     Effort: Pulmonary effort is normal. No respiratory distress.     Breath sounds: No wheezing, rhonchi or rales.  Musculoskeletal:        General: No swelling. Normal range of motion.     Cervical back: Normal range of motion and neck supple.  Neurological:      General: No focal deficit present.     Mental Status: He is alert and oriented to person, place, and time.     Cranial Nerves: No cranial nerve deficit (Grossly intact).     Motor: No weakness.  Psychiatric:        Mood and Affect: Mood normal.        Behavior: Behavior normal.        Thought Content: Thought content normal.        Judgment: Judgment normal.    Adult ECG Report  Rate: 59 ;  Rhythm: normal sinus rhythm and RBBB.  Cannot exclude inferior MI, age-indeterminate. ;   Narrative Interpretation: Stable  Recent Labs: Due to be checked. Lab Results  Component Value Date   CHOL 167 05/26/2019   HDL 41 05/26/2019   LDLCALC 103 (H) 05/26/2019   TRIG 128 05/26/2019   CHOLHDL 4.1 05/26/2019   Lab Results  Component Value Date   CREATININE 0.90 05/15/2019   BUN 17 05/15/2019   NA 141 05/15/2019   K 4.3 05/15/2019   CL 100 05/15/2019   CO2 25 05/15/2019   ==================================================  COVID-19 Education: The signs and symptoms of COVID-19 were discussed with the patient and how to seek care for testing (follow up with PCP or arrange E-visit).    I spent a total of 15 minutes with the patient spent in  direct patient consultation.  Additional time spent with chart review  / charting (studies, outside notes, etc): 14 min Total Time: 29 min  Current medicines are reviewed at length with the patient today.  (+/- concerns) n/a  This visit occurred during the SARS-CoV-2 public health emergency.  Safety protocols were in place, including screening questions prior to the visit, additional usage of staff PPE, and extensive cleaning of exam room while observing appropriate contact time as indicated for disinfecting solutions.  Notice: This dictation was prepared with Dragon dictation along with smaller phrase technology. Any transcriptional errors that result from this process are unintentional and may not be corrected upon review.  Patient Instructions  / Medication Changes & Studies & Tests Ordered   Patient Instructions  Medication Instructions:  Not needed *If you need a refill on your cardiac medications before your next appointment, please call your pharmacy*   Lab Work: Lipid - fasting cmp If you have labs (blood work) drawn today and your tests are completely normal, you will receive your results only by: [place] (if you have MyChart) OR A paper copy in the mail If you have any lab test that is abnormal or we need to change your treatment, we will call you to review the results.   Testing/Procedures:   Follow-Up: At [facility], you and your health needs are our priority.  As part of our continuing mission to provide you with exceptional heart care, we have created designated Provider Care Teams.  These Care Teams include your primary Cardiologist (physician) and Advanced Practice Providers (APPs -  Physician Assistants and Nurse Practitioners) who all work together to provide you with the care you need, when you need it.  We recommend signing up for the patient portal called "MyChart".  Sign up information is provided on this After Visit Summary.  MyChart is used to connect with patients for Virtual Visits (Telemedicine).  Patients are able to view lab/test results, encounter notes, upcoming appointments, etc.  Non-urgent messages can be sent to your provider as well.   To learn more about what you can do with MyChart, go to [email or web address].    Your next appointment:   12 month(s)  The format for your next appointment:   In Person  Provider:   Glenetta Hew, MD   Studies Ordered:   Orders Placed This Encounter  Procedures   Lipid panel   Comprehensive metabolic panel   EKG [id number]-[id number]      Glenetta Hew, M.D., M.S. Interventional Cardiologist   Pager # [phone] Phone # [phone] [address]. [place], [place] [address]   Thank you for choosing Heartcare at [facility]!!

## 2020-09-05 NOTE — Patient Instructions (Addendum)
Medication Instructions:  Not needed *If you need a refill on your cardiac medications before your next appointment, please call your pharmacy*   Lab Work: Lipid - fasting cmp If you have labs (blood work) drawn today and your tests are completely normal, you will receive your results only by: [place] (if you have MyChart) OR A paper copy in the mail If you have any lab test that is abnormal or we need to change your treatment, we will call you to review the results.   Testing/Procedures:   Follow-Up: At [facility], you and your health needs are our priority.  As part of our continuing mission to provide you with exceptional heart care, we have created designated Provider Care Teams.  These Care Teams include your primary Cardiologist (physician) and Advanced Practice Providers (APPs -  Physician Assistants and Nurse Practitioners) who all work together to provide you with the care you need, when you need it.  We recommend signing up for the patient portal called "MyChart".  Sign up information is provided on this After Visit Summary.  MyChart is used to connect with patients for Virtual Visits (Telemedicine).  Patients are able to view lab/test results, encounter notes, upcoming appointments, etc.  Non-urgent messages can be sent to your provider as well.   To learn more about what you can do with MyChart, go to [email or web address].    Your next appointment:   12 month(s)  The format for your next appointment:   In Person  Provider:   Glenetta Hew, MD

## 2020-09-06 DIAGNOSIS — Z9861 Coronary angioplasty status: Secondary | ICD-10-CM | POA: Diagnosis not present

## 2020-09-06 DIAGNOSIS — E785 Hyperlipidemia, unspecified: Secondary | ICD-10-CM | POA: Diagnosis not present

## 2020-09-06 DIAGNOSIS — I2119 ST elevation (STEMI) myocardial infarction involving other coronary artery of inferior wall: Secondary | ICD-10-CM | POA: Diagnosis not present

## 2020-09-06 DIAGNOSIS — I2510 Atherosclerotic heart disease of native coronary artery without angina pectoris: Secondary | ICD-10-CM | POA: Diagnosis not present

## 2020-09-06 DIAGNOSIS — I10 Essential (primary) hypertension: Secondary | ICD-10-CM | POA: Diagnosis not present

## 2020-09-07 LAB — COMPREHENSIVE METABOLIC PANEL
ALT: 22 IU/L (ref 0–44)
AST: 22 IU/L (ref 0–40)
Albumin/Globulin Ratio: 2.1 (ref 1.2–2.2)
Albumin: 4 g/dL (ref 3.8–4.9)
Alkaline Phosphatase: 84 IU/L (ref 44–121)
BUN/Creatinine Ratio: 16 (ref 10–24)
BUN: 12 mg/dL (ref 8–27)
Bilirubin Total: 0.2 mg/dL (ref 0.0–1.2)
CO2: 18 mmol/L — ABNORMAL LOW (ref 20–29)
Calcium: 9.1 mg/dL (ref 8.6–10.2)
Chloride: 108 mmol/L — ABNORMAL HIGH (ref 96–106)
Creatinine, Ser: 0.76 mg/dL (ref 0.76–1.27)
Globulin, Total: 1.9 g/dL (ref 1.5–4.5)
Glucose: 96 mg/dL (ref 65–99)
Potassium: 4.7 mmol/L (ref 3.5–5.2)
Sodium: 138 mmol/L (ref 134–144)
Total Protein: 5.9 g/dL — ABNORMAL LOW (ref 6.0–8.5)
eGFR: 103 mL/min/{1.73_m2} (ref >59)

## 2020-09-07 LAB — LIPID PANEL
Chol/HDL Ratio: 4.3 ratio (ref 0.0–5.0)
Cholesterol, Total: 150 mg/dL (ref 100–199)
HDL: 35 mg/dL — ABNORMAL LOW (ref >39)
LDL Chol Calc (NIH): 85 mg/dL (ref 0–99)
Triglycerides: 174 mg/dL — ABNORMAL HIGH (ref 0–149)
VLDL Cholesterol Cal: 30 mg/dL (ref 5–40)

## 2020-09-16 ENCOUNTER — Encounter: Payer: Self-pay | Admitting: Cardiology

## 2020-09-16 NOTE — Assessment & Plan Note (Signed)
He took a Thienopyridine for couple years, but no longer willing to take anything besides aspirin.  He has tried stopping aspirin which I told him was not acceptable.

## 2020-09-16 NOTE — Assessment & Plan Note (Signed)
He needs lipids checked.  We will order lipid panel and CMP.  He is on Zetia, and may be perhaps we can get upgrade to Nexletol if lipids are not at goal.  I do not think eventually doing any therefore I do not think inclisiranfor PCSK9 inhibitors would be viable options.  He is not going to take a statin.

## 2020-09-16 NOTE — Assessment & Plan Note (Signed)
7-1/2 years out from his MI.  Doing well with no recurrent angina or heart failure symptoms.  EF pretty much preserved on echo.  He is very reluctant to take any medications and therefore difficult to manage. He is on aspirin and ezetimibe along with Krill oil.  Very reluctant to take any other medications.

## 2020-09-16 NOTE — Assessment & Plan Note (Signed)
Distant history of PCI of the RCA with 2 overlapping stents.  As much I would like for him to be on maintenance dose Thienopyridine, he insisted on stopping because of bleeding or bruising issues.  He was agreeable to take aspirin, but gradually.  Difficult to manage because of his unwillingness to take medications.  Intolerant of statins because of myalgias and memory issues.  Intolerant of beta-blocker because of bradycardia and fatigue.  Unwilling to try ACE inhibitor or ARB-but now his blood pressure Artley stable.  Plan: For now continue aspirin and Zetia.  Potentially converting to Nexlizet

## 2020-09-16 NOTE — Assessment & Plan Note (Addendum)
Not really true diagnosis.  More borderline.  Current blood pressure is 122/78 which per him was in the normal range.  He was not tolerant of beta-blocker because of bradycardia and fatigue.  He does not want to take any medication for blood pressure.  Given that he has no cardiac symptoms today,, hard to argue.

## 2020-09-16 NOTE — Assessment & Plan Note (Signed)
He has tried several different statins, at this point he will not take another 1.  We talked about in detail.  Currently taken Zetia and very happily.  He also takes fish oil with questionable benefit.  Plan: Recheck lipid panel-potentially consider adding Nexletol in the form of Nexlizet

## 2020-09-16 NOTE — Assessment & Plan Note (Signed)
Unable to tolerate beta-blocker or 9 dihydropyridine calcium underwater.  Avoid AV nodal agents.  Became quite fatigued while on medications.

## 2020-09-16 NOTE — Assessment & Plan Note (Signed)
Slowly trying to lose weight, but has plateaued.  He is very active, and we discussed importance of adjusting his diet, especially if he does not want take a cholesterol-lowering medication.

## 2020-09-27 ENCOUNTER — Other Ambulatory Visit: Payer: Self-pay | Admitting: Cardiology

## 2020-10-03 DIAGNOSIS — H47093 Other disorders of optic nerve, not elsewhere classified, bilateral: Secondary | ICD-10-CM | POA: Diagnosis not present

## 2020-10-04 DIAGNOSIS — H35033 Hypertensive retinopathy, bilateral: Secondary | ICD-10-CM | POA: Diagnosis not present

## 2020-10-04 DIAGNOSIS — H353132 Nonexudative age-related macular degeneration, bilateral, intermediate dry stage: Secondary | ICD-10-CM | POA: Diagnosis not present

## 2020-10-04 DIAGNOSIS — D3131 Benign neoplasm of right choroid: Secondary | ICD-10-CM | POA: Diagnosis not present

## 2020-10-04 DIAGNOSIS — H43822 Vitreomacular adhesion, left eye: Secondary | ICD-10-CM | POA: Diagnosis not present

## 2020-10-07 ENCOUNTER — Encounter: Payer: Self-pay | Admitting: *Deleted

## 2020-10-07 ENCOUNTER — Telehealth: Payer: Self-pay | Admitting: *Deleted

## 2020-10-07 MED ORDER — NEXLETOL 180 MG PO TABS: 180.0000 mg | ORAL_TABLET | Freq: Every day | ORAL | 5 refills | Status: AC

## 2020-10-07 NOTE — Telephone Encounter (Signed)
Attempt to contact patient by phone without success . Letter mailed  Prescription sent to pharmacy

## 2020-10-07 NOTE — Telephone Encounter (Signed)
-----   Message from Leonie Man, MD sent at 09/16/2020  9:45 PM EDT ----- Chemistry panel looks pretty good.  Normal kidney and liver function  At this cholesterol levels do look better.  Total cholesterol is down to 150 strides are little high and that may have to do with certain diet issues.  This tends to go along with higher levels of starch or sugar/sweets in diet.  The bad cholesterol/LDL is down from 10 3-85.  Our goal is actually less than 70.  I would like to try a new medication that works in conjunction with [place].  It is called Nexletol.  This does not have any of the statin type side effects and has been shown to be beneficial in lowering lipid levels, especially in conjunction with Zetia/ezetimibe.  Lets start with Nexletol 180 mg p.o. daily.  # 30 - 3 refils.    If able to tolerate - we can convert to a Combo Med ezetimibe/Nexletol (called Nexlizet) - 10/180 mg daily.  We can then recheck Chol panel after ~3-4 months.   Glenetta Hew, MD.

## 2020-10-11 DIAGNOSIS — H318 Other specified disorders of choroid: Secondary | ICD-10-CM | POA: Diagnosis not present

## 2020-10-20 DIAGNOSIS — D487 Neoplasm of uncertain behavior of other specified sites: Secondary | ICD-10-CM | POA: Diagnosis not present

## 2020-10-20 DIAGNOSIS — H3581 Retinal edema: Secondary | ICD-10-CM | POA: Diagnosis not present

## 2020-11-21 ENCOUNTER — Telehealth: Payer: Self-pay | Admitting: Cardiology

## 2020-11-21 DIAGNOSIS — L03116 Cellulitis of left lower limb: Secondary | ICD-10-CM | POA: Diagnosis not present

## 2020-11-21 NOTE — Telephone Encounter (Signed)
Pt c/o medication issue:  1. Name of Medication: N/A  2. How are you currently taking this medication (dosage and times per day)? N/A  3. Are you having a reaction (difficulty breathing--STAT)? No  4. What is your medication issue? Jackelyn Poling is calling stating the pharmacy advised them they are needing PA for the medication Dr. Ellyn Hack last prescribed at his last visit. She states she is unsure of the name, but the pharmacy has faxed several things in regards to it.

## 2020-11-21 NOTE — Telephone Encounter (Signed)
Patient called in- stating that the Nexlatol was not approved by insurance and was needing a PA.  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  Thank you!

## 2020-11-23 DIAGNOSIS — H318 Other specified disorders of choroid: Secondary | ICD-10-CM | POA: Diagnosis not present

## 2020-11-23 NOTE — Telephone Encounter (Signed)
**Note De-Identified Javonn Gauger Obfuscation** Nexletol PA started through covermymeds and the following message was received:  Lajuan Lines Key: [id number] - PA Case ID: [id number] Outcome: Approved today Coverage Starts on: 11/23/2020 12:00:00 AM, Coverage Ends on: 11/23/2021 12:00:00 AM. Drug: Nexletol '180MG'$  tablets Form: Librarian, academic PA Form (2017 NCPDP)  I have notified [facility] in [place] of this approval.

## 2021-02-22 DIAGNOSIS — H318 Other specified disorders of choroid: Secondary | ICD-10-CM | POA: Diagnosis not present

## 2021-03-23 DIAGNOSIS — U071 COVID-19: Secondary | ICD-10-CM | POA: Diagnosis not present

## 2021-03-23 DIAGNOSIS — I10 Essential (primary) hypertension: Secondary | ICD-10-CM | POA: Diagnosis not present

## 2021-06-20 ENCOUNTER — Ambulatory Visit (HOSPITAL_COMMUNITY)
Admission: RE | Admit: 2021-06-20 | Discharge: 2021-06-20 | Disposition: A | Discharge status: Home / Self Care | Payer: BLUE CROSS/BLUE SHIELD | Source: Ambulatory Visit | Attending: Physician Assistant | Admitting: Physician Assistant

## 2021-06-20 ENCOUNTER — Other Ambulatory Visit (HOSPITAL_COMMUNITY): Payer: Self-pay | Admitting: Physician Assistant

## 2021-06-20 DIAGNOSIS — I2510 Atherosclerotic heart disease of native coronary artery without angina pectoris: Secondary | ICD-10-CM | POA: Insufficient documentation

## 2021-06-20 DIAGNOSIS — F17200 Nicotine dependence, unspecified, uncomplicated: Secondary | ICD-10-CM

## 2021-06-20 DIAGNOSIS — M545 Low back pain, unspecified: Secondary | ICD-10-CM | POA: Diagnosis not present

## 2021-06-20 DIAGNOSIS — G4700 Insomnia, unspecified: Secondary | ICD-10-CM | POA: Diagnosis not present

## 2021-06-20 DIAGNOSIS — Z6832 Body mass index (BMI) 32.0-32.9, adult: Secondary | ICD-10-CM | POA: Diagnosis not present

## 2021-06-20 DIAGNOSIS — R42 Dizziness and giddiness: Secondary | ICD-10-CM | POA: Diagnosis not present

## 2021-06-20 DIAGNOSIS — E7849 Other hyperlipidemia: Secondary | ICD-10-CM | POA: Diagnosis not present

## 2021-06-20 DIAGNOSIS — E782 Mixed hyperlipidemia: Secondary | ICD-10-CM | POA: Diagnosis not present

## 2021-06-20 DIAGNOSIS — E6609 Other obesity due to excess calories: Secondary | ICD-10-CM | POA: Diagnosis not present

## 2021-06-20 DIAGNOSIS — I10 Essential (primary) hypertension: Secondary | ICD-10-CM | POA: Diagnosis not present

## 2021-06-28 DIAGNOSIS — D487 Neoplasm of uncertain behavior of other specified sites: Secondary | ICD-10-CM | POA: Diagnosis not present

## 2021-10-28 ENCOUNTER — Other Ambulatory Visit: Payer: Self-pay | Admitting: Cardiology

## 2023-06-16 IMAGING — DX DG CHEST 2V
2 series · 2 of 2 positions shown · non-contrast
Comparison: Chest x-ray 02/27/2013.

CLINICAL DATA: 60-year-old male with history of intermittent
dizziness since [REDACTED].

EXAM:
CHEST - 2 VIEW

[chest pa]
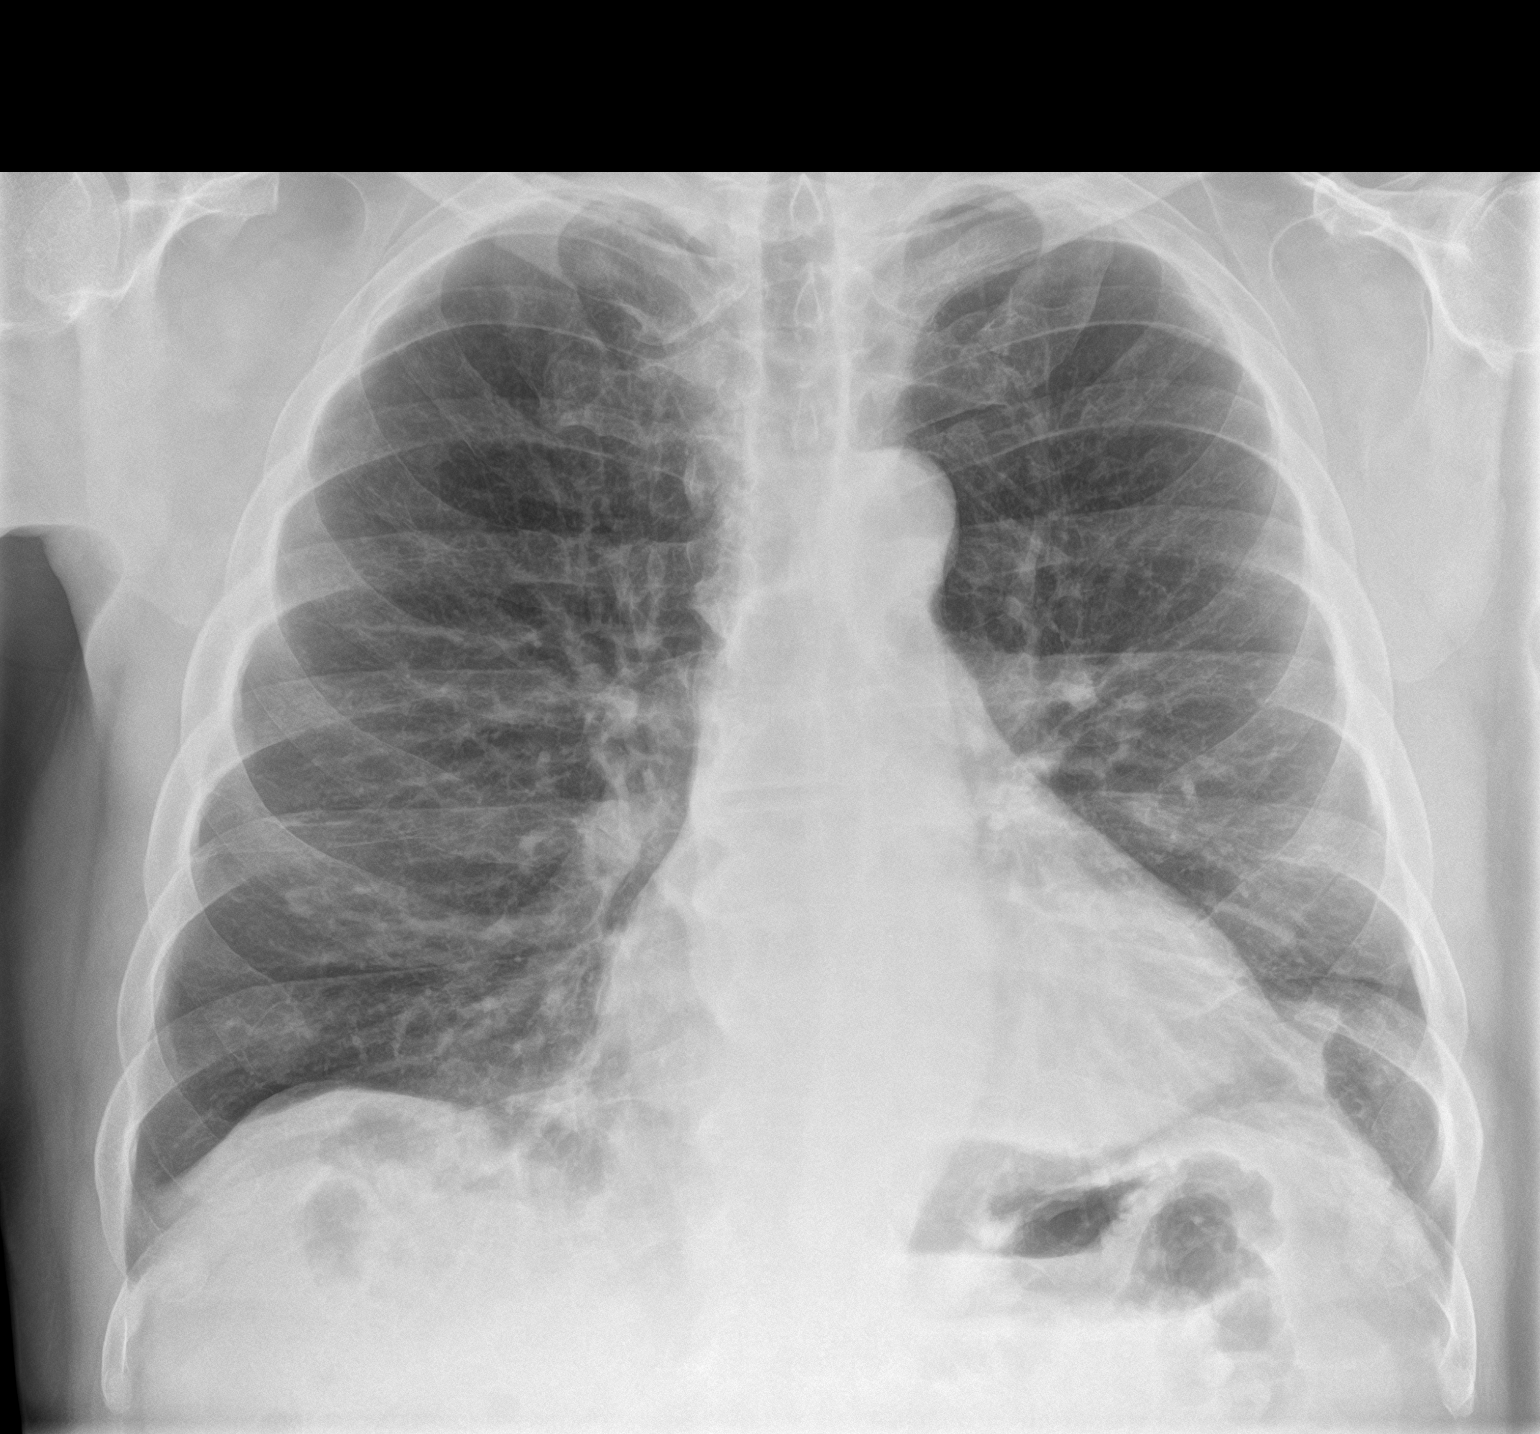

[chest lat]
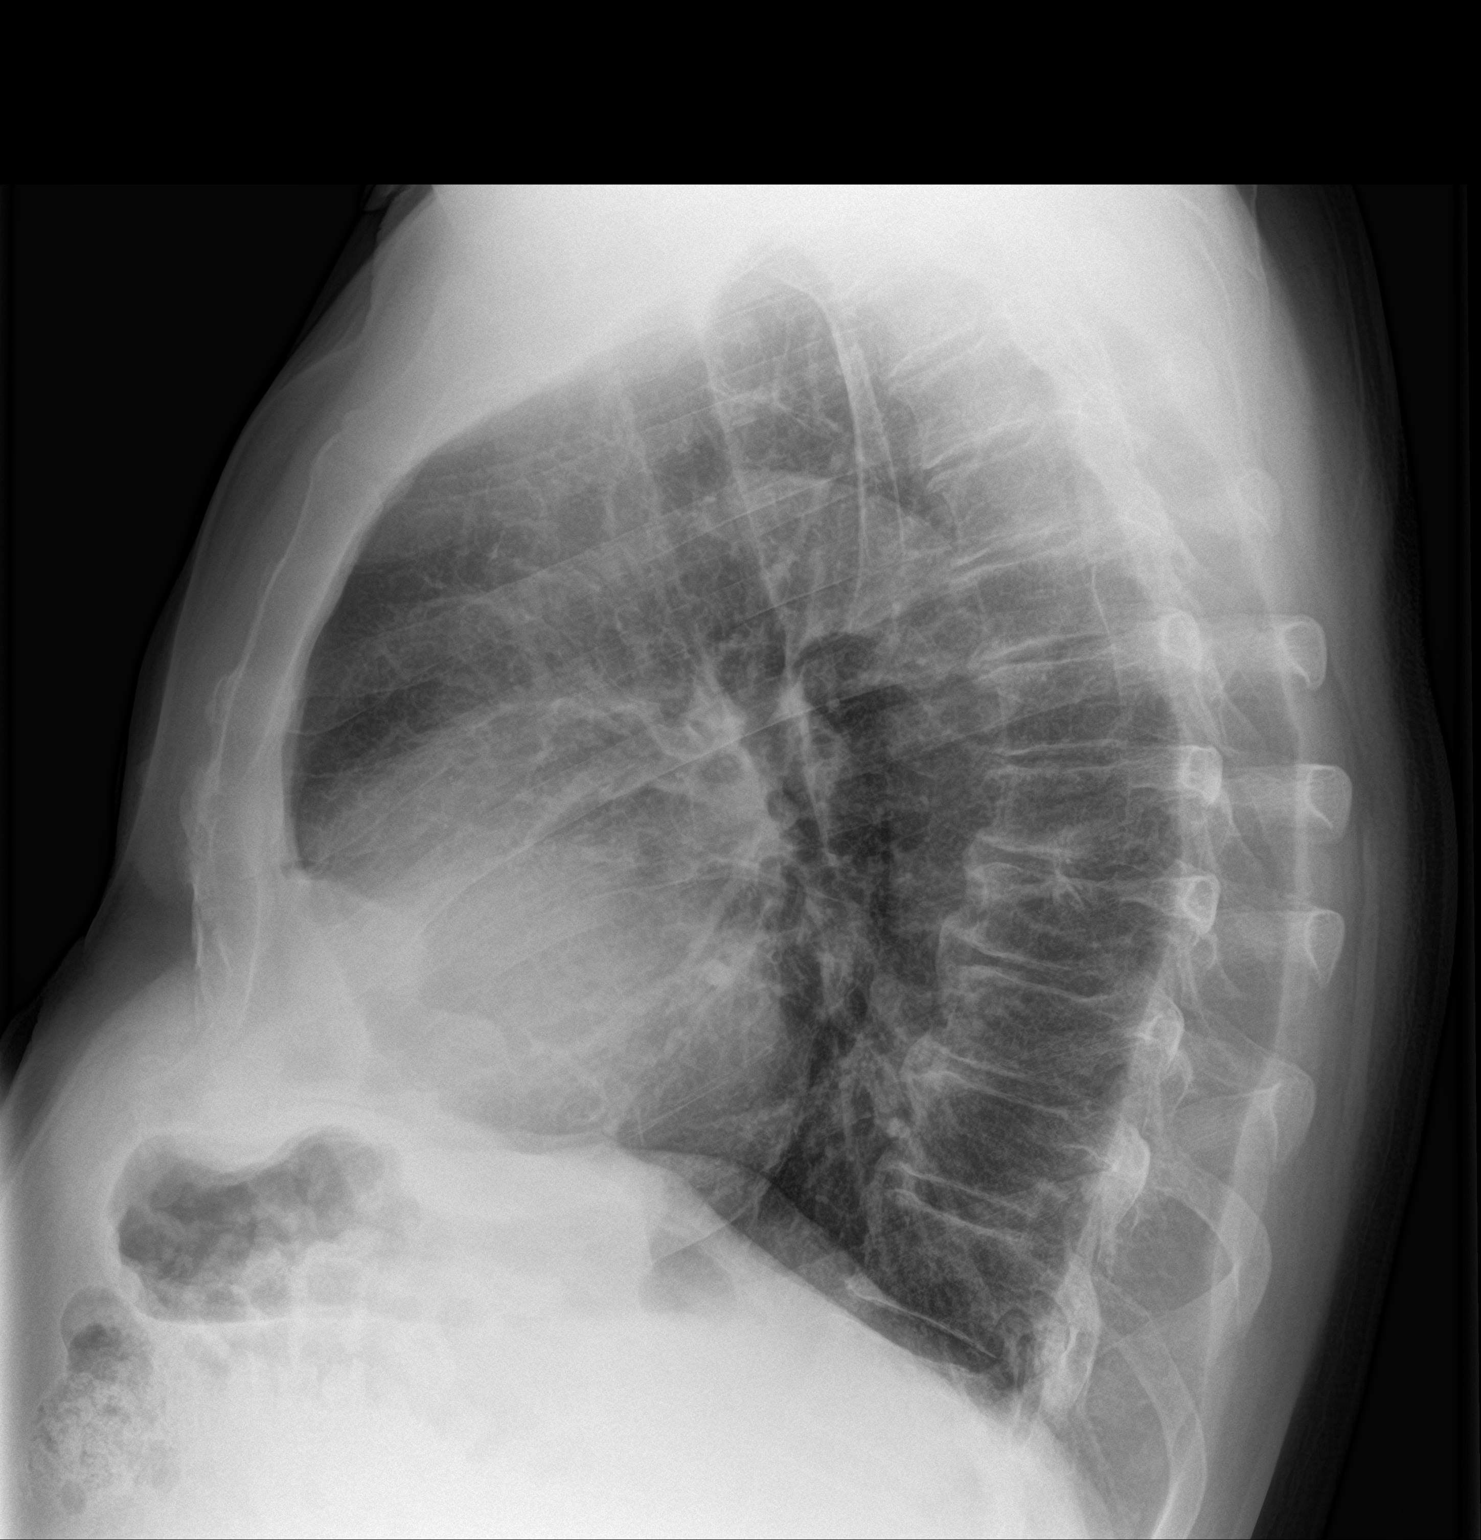

[2 of 2 positions shown; findings below may reference images not displayed]

FINDINGS: Lung volumes are normal. No consolidative airspace disease. No
pleural effusions. No pneumothorax. No pulmonary nodule or mass
noted. Pulmonary vasculature and the cardiomediastinal silhouette
are within normal limits.
IMPRESSION: No radiographic evidence of acute cardiopulmonary disease.
